# Patient Record
Sex: Female | Born: 1985 | Race: Black or African American | Hispanic: No | Marital: Single | Smoking: Never smoker
Health system: Southern US, Community
[De-identification: ages and names within clinical notes are randomized; demographics above are authoritative.]

## PROBLEM LIST (undated history)

## (undated) DIAGNOSIS — F32A Depression, unspecified: Secondary | ICD-10-CM

## (undated) DIAGNOSIS — G43909 Migraine, unspecified, not intractable, without status migrainosus: Secondary | ICD-10-CM

---

## 2007-05-02 ENCOUNTER — Emergency Department (HOSPITAL_COMMUNITY): Admission: EM | Admit: 2007-05-02 | Discharge: 2007-05-02 | Payer: Self-pay | Admitting: Emergency Medicine

## 2007-07-17 ENCOUNTER — Emergency Department (HOSPITAL_COMMUNITY): Admission: EM | Admit: 2007-07-17 | Discharge: 2007-07-17 | Payer: Self-pay | Admitting: Emergency Medicine

## 2008-03-02 ENCOUNTER — Emergency Department (HOSPITAL_COMMUNITY): Admission: EM | Admit: 2008-03-02 | Discharge: 2008-03-02 | Payer: Self-pay | Admitting: Emergency Medicine

## 2008-10-20 ENCOUNTER — Emergency Department (HOSPITAL_COMMUNITY): Admission: EM | Admit: 2008-10-20 | Discharge: 2008-10-20 | Payer: Self-pay | Admitting: Emergency Medicine

## 2009-06-24 ENCOUNTER — Emergency Department (HOSPITAL_COMMUNITY): Admission: EM | Admit: 2009-06-24 | Discharge: 2009-06-25 | Payer: Self-pay | Admitting: Emergency Medicine

## 2010-03-21 LAB — URINALYSIS, ROUTINE W REFLEX MICROSCOPIC
Bilirubin Urine: NEGATIVE
Glucose, UA: NEGATIVE mg/dL
Protein, ur: NEGATIVE mg/dL
Specific Gravity, Urine: 1.017 (ref 1.005–1.030)
Urobilinogen, UA: 1 mg/dL (ref 0.0–1.0)

## 2010-03-21 LAB — URINE MICROSCOPIC-ADD ON

## 2010-03-21 LAB — POCT PREGNANCY, URINE: Preg Test, Ur: NEGATIVE

## 2010-09-28 LAB — URINALYSIS, ROUTINE W REFLEX MICROSCOPIC
Bilirubin Urine: NEGATIVE
Ketones, ur: NEGATIVE
Nitrite: NEGATIVE
Specific Gravity, Urine: 1.02
pH: 6

## 2010-09-28 LAB — COMPREHENSIVE METABOLIC PANEL
ALT: 11
BUN: 7
Creatinine, Ser: 0.65
GFR calc Af Amer: >60
Glucose, Bld: 86
Potassium: 4.2
Total Bilirubin: 0.5

## 2010-09-28 LAB — DIFFERENTIAL: Neutro Abs: 3.3

## 2010-09-28 LAB — CBC
HCT: 36.5
RBC: 4.25
WBC: 5.8

## 2010-09-28 LAB — POCT PREGNANCY, URINE: Preg Test, Ur: NEGATIVE

## 2010-12-27 ENCOUNTER — Encounter: Payer: Self-pay | Admitting: *Deleted

## 2010-12-27 ENCOUNTER — Emergency Department (HOSPITAL_COMMUNITY)
Admission: EM | Admit: 2010-12-27 | Discharge: 2010-12-27 | Disposition: A | Discharge status: Home / Self Care | Payer: Managed Care, Other (non HMO) | Source: Home / Self Care

## 2010-12-27 ENCOUNTER — Emergency Department (INDEPENDENT_AMBULATORY_CARE_PROVIDER_SITE_OTHER): Payer: Managed Care, Other (non HMO)

## 2010-12-27 DIAGNOSIS — S93409A Sprain of unspecified ligament of unspecified ankle, initial encounter: Secondary | ICD-10-CM

## 2010-12-27 DIAGNOSIS — S93401A Sprain of unspecified ligament of right ankle, initial encounter: Secondary | ICD-10-CM

## 2010-12-27 MED ORDER — IBUPROFEN 800 MG PO TABS: 800.0000 mg | ORAL_TABLET | Freq: Three times a day (TID) | ORAL | Status: AC

## 2010-12-27 NOTE — ED Notes (Signed)
Pt  Felled  2  Days  Ago  And  Injured  Her  r  Ankle  -  She  Has  Pain /  Swelling to  The  Affected  Ankle    She  Has  Been    Using  Crutches       Which  She  Has  With  Her  She  Has  Old  Injury  To the  Ankle

## 2010-12-27 NOTE — ED Provider Notes (Signed)
Medical screening examination/treatment/procedure(s) were performed by non-physician practitioner and as supervising physician I was immediately available for consultation/collaboration.  Raynald Blend, MD 12/27/10 [phone]

## 2010-12-27 NOTE — ED Provider Notes (Signed)
History     CSN: [id number]  Arrival date & time 12/27/10  1109   None     Chief Complaint  Patient presents with  . Ankle Pain    (Consider location/radiation/quality/duration/timing/severity/associated sxs/prior treatment) HPI Comments: Pt states she slipped and fell on her bathroom floor 2 days ago injuring her Rt ankle. She c/o pain and swelling lateral Rt ankle. Pain worsens with weight bearing and walking. She took Motrin yesterday but was worried because their was no improvement today. She is using crutches from a previous ankle sprain. She denies any other injury or areas of pain.    History reviewed. No pertinent past medical history.  History reviewed. No pertinent past surgical history.  History reviewed. No pertinent family history.  History  Substance Use Topics  . Smoking status: Never Smoker   . Smokeless tobacco: Not on file  . Alcohol Use: Yes    OB History    Grav Para Term Preterm Abortions TAB SAB Ect Mult Living                  Review of Systems  Musculoskeletal: Positive for joint swelling.  Skin: Negative for color change and wound.  Neurological: Negative for dizziness and headaches.    Allergies  Review of patient's allergies indicates no known allergies.  Home Medications   Current Outpatient Rx  Name Route Sig Dispense Refill  . IBUPROFEN 800 MG PO TABS Oral Take 1 tablet (800 mg total) by mouth 3 (three) times daily. 15 tablet 0    BP 133/80  Pulse 77  Temp(Src) 97.9 F (36.6 C) (Oral)  Resp 16  SpO2 100%  LMP 12/18/2010  Physical Exam  Nursing note and vitals reviewed. Constitutional: She appears well-developed and well-nourished. No distress.  Cardiovascular: Normal rate, regular rhythm and normal heart sounds.   Pulmonary/Chest: Effort normal and breath sounds normal. No respiratory distress.  Musculoskeletal:       Right ankle: She exhibits decreased range of motion and swelling. She exhibits no ecchymosis, no  laceration and normal pulse. tenderness. Lateral malleolus (anterior) and posterior TFL tenderness found. No medial malleolus (anterior), no AITFL, no CF ligament, no head of 5th metatarsal and no proximal fibula tenderness found. Achilles tendon normal.       Neg Drawer. No ligament instability.  Neurological: She has normal strength. No sensory deficit.  Skin: Skin is warm and dry.  Psychiatric: She has a normal mood and affect.    ED Course  Procedures (including critical care time)  Labs Reviewed - No data to display Dg Ankle Complete Right  12/27/2010  *RADIOLOGY REPORT*  Clinical Data: Fall, pain.  RIGHT ANKLE - COMPLETE 3+ VIEW  Comparison: Plain films 07/17/2007.  Findings: There is some soft tissue swelling about the lateral malleolus.  No underlying fracture.  No tibiotalar joint effusion.  IMPRESSION: Lateral soft tissue swelling without underlying fracture.  Original Report Authenticated By: Bernadene Bell. D'ALESSIO, M.D.     1. Right ankle sprain       MDM  Ankle xray neg        Melody Comas, [place] 12/27/10 1416

## 2011-02-05 ENCOUNTER — Encounter (HOSPITAL_COMMUNITY): Payer: Self-pay | Admitting: *Deleted

## 2011-02-05 ENCOUNTER — Emergency Department (HOSPITAL_COMMUNITY)
Admission: EM | Admit: 2011-02-05 | Discharge: 2011-02-05 | Disposition: A | Discharge status: Home / Self Care | Payer: Managed Care, Other (non HMO) | Source: Home / Self Care | Attending: Family Medicine | Admitting: Family Medicine

## 2011-02-05 DIAGNOSIS — J020 Streptococcal pharyngitis: Secondary | ICD-10-CM

## 2011-02-05 MED ORDER — PENICILLIN V POTASSIUM 500 MG PO TABS: 500.0000 mg | ORAL_TABLET | Freq: Three times a day (TID) | ORAL | Status: AC

## 2011-02-05 NOTE — ED Notes (Signed)
Pt with onset sore throat yesterday denies cough - mild sinus congestion

## 2011-02-05 NOTE — ED Provider Notes (Signed)
History     CSN: [id number]  Arrival date & time 02/05/11  1201   First MD Initiated Contact with Patient 02/05/11 1325      Chief Complaint  Patient presents with  . Sore Throat    (Consider location/radiation/quality/duration/timing/severity/associated sxs/prior treatment) HPI Comments: Laura Rubio presents for evaluation of sore throat that started yesterday. She denies fever, cough, runny nose. 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 She says that they were evaluated for strep throat, which was negative.  Patient is a 26 y.o. female presenting with pharyngitis. The history is provided by the patient.  Sore Throat This is a new problem. The current episode started yesterday. The problem occurs constantly. The problem has not changed since onset.The symptoms are aggravated by swallowing and eating. The symptoms are relieved by nothing.    History reviewed. No pertinent past medical history.  History reviewed. No pertinent past surgical history.  History reviewed. No pertinent family history.  History  Substance Use Topics  . Smoking status: Never Smoker   . Smokeless tobacco: Not on file  . Alcohol Use: Yes    OB History    Grav Para Term Preterm Abortions TAB SAB Ect Mult Living                  Review of Systems  Constitutional: Positive for chills. Negative for fever.  HENT: Positive for sore throat. Negative for trouble swallowing.   Eyes: Negative.   Respiratory: Negative.   Cardiovascular: Negative.   Gastrointestinal: Negative.   Genitourinary: Negative.   Musculoskeletal: Positive for myalgias.  Skin: Negative.     Allergies  Review of patient's allergies indicates no known allergies.  Home Medications   Current Outpatient Rx  Name Route Sig Dispense Refill  . PENICILLIN V POTASSIUM 500 MG PO TABS Oral Take 1 tablet (500 mg total) by mouth 3 (three) times daily. 30 tablet 0    BP  128/92  Pulse 69  Temp(Src) 98.9 F (37.2 C) (Oral)  Resp 17  SpO2 100%  LMP 01/18/2011  Physical Exam  Nursing note and vitals reviewed. Constitutional: She is oriented to person, place, and time. She appears well-developed and well-nourished.  HENT:  Head: Normocephalic and atraumatic.  Right Ear: Tympanic membrane normal.  Left Ear: Tympanic membrane normal.  Mouth/Throat: Uvula is midline. Oropharyngeal exudate, posterior oropharyngeal edema and posterior oropharyngeal erythema present.       Tonsillar hypertrophy with exudates  Eyes: EOM are normal.  Neck: Normal range of motion.  Pulmonary/Chest: Effort normal.  Musculoskeletal: Normal range of motion.  Neurological: She is alert and oriented to person, place, and time.  Skin: Skin is warm and dry.  Psychiatric: Her behavior is normal.    ED Course  Procedures (including critical care time)  Labs Reviewed  POCT RAPID STREP A ([facility] ONLY) - Abnormal; Notable for the following:    Streptococcus, Group A Screen (Direct) POSITIVE (*)    All other components within normal limits   No results found.   1. Strep pharyngitis       MDM  Labs reviewed. rx given for penicillin V       Richardo Priest, MD 02/05/11 1421

## 2012-04-29 ENCOUNTER — Encounter (HOSPITAL_COMMUNITY): Payer: Self-pay | Admitting: Emergency Medicine

## 2012-04-29 ENCOUNTER — Emergency Department (HOSPITAL_COMMUNITY)
Admission: EM | Admit: 2012-04-29 | Discharge: 2012-04-30 | Disposition: A | Discharge status: Home / Self Care | Payer: Self-pay | Attending: Emergency Medicine | Admitting: Emergency Medicine

## 2012-04-29 ENCOUNTER — Emergency Department (HOSPITAL_COMMUNITY): Payer: Self-pay

## 2012-04-29 DIAGNOSIS — Z3202 Encounter for pregnancy test, result negative: Secondary | ICD-10-CM | POA: Insufficient documentation

## 2012-04-29 DIAGNOSIS — R109 Unspecified abdominal pain: Secondary | ICD-10-CM

## 2012-04-29 DIAGNOSIS — R300 Dysuria: Secondary | ICD-10-CM | POA: Insufficient documentation

## 2012-04-29 DIAGNOSIS — R824 Acetonuria: Secondary | ICD-10-CM | POA: Insufficient documentation

## 2012-04-29 LAB — CBC WITH DIFFERENTIAL/PLATELET
Basophils Relative: 0 % (ref 0–1)
HCT: 37.1 % (ref 36.0–46.0)
Hemoglobin: 12.6 g/dL (ref 12.0–15.0)
Lymphocytes Relative: 34 % (ref 12–46)
Lymphs Abs: 2.7 10*3/uL (ref 0.7–4.0)
Monocytes Absolute: 0.8 10*3/uL (ref 0.1–1.0)
Monocytes Relative: 9 % (ref 3–12)
Neutro Abs: 4.5 10*3/uL (ref 1.7–7.7)
Neutrophils Relative %: 56 % (ref 43–77)
RBC: 4.5 MIL/uL (ref 3.87–5.11)
WBC: 8 10*3/uL (ref 4.0–10.5)

## 2012-04-29 LAB — POCT I-STAT, CHEM 8
BUN: 4 mg/dL — ABNORMAL LOW (ref 6–23)
Calcium, Ion: 1.23 mmol/L (ref 1.12–1.23)
Chloride: 102 mEq/L (ref 96–112)
Glucose, Bld: 86 mg/dL (ref 70–99)
HCT: 40 % (ref 36.0–46.0)
Potassium: 3.9 mEq/L (ref 3.5–5.1)

## 2012-04-29 LAB — URINALYSIS, ROUTINE W REFLEX MICROSCOPIC
Glucose, UA: NEGATIVE mg/dL
Ketones, ur: 40 mg/dL — AB
Nitrite: NEGATIVE
Specific Gravity, Urine: 1.03 (ref 1.005–1.030)
Urobilinogen, UA: 0.2 mg/dL (ref 0.0–1.0)

## 2012-04-29 LAB — URINE MICROSCOPIC-ADD ON

## 2012-04-29 LAB — WET PREP, GENITAL: Clue Cells Wet Prep HPF POC: NONE SEEN

## 2012-04-29 MED ORDER — IOHEXOL 300 MG/ML  SOLN
100.0000 mL | Freq: Once | INTRAMUSCULAR | Status: AC | PRN
  Administered 2012-04-29: 100 mL via INTRAVENOUS

## 2012-04-29 MED ORDER — IOHEXOL 300 MG/ML  SOLN
20.0000 mL | INTRAMUSCULAR | Status: AC
  Administered 2012-04-29: 50 mL via ORAL

## 2012-04-29 NOTE — ED Notes (Signed)
Pt. Stated, I started having symptoms in March and try to treat myself with water and cranberry juice.  It came back again, I'm having burning and urinary frequency.

## 2012-04-29 NOTE — ED Notes (Signed)
Advised of the wait time 

## 2012-04-29 NOTE — ED Notes (Signed)
Pelvic done by Dr. Hyacinth Meeker with Illene Bolus as chaperone.

## 2012-04-29 NOTE — ED Provider Notes (Signed)
History     CSN: [id number]  Arrival date & time 04/29/12  1345   First MD Initiated Contact with Patient 04/29/12 1625      Chief Complaint  Patient presents with  . Urinary Frequency    (Consider location/radiation/quality/duration/timing/severity/associated sxs/prior treatment) HPI Comments: The patient is a 27 year old female G0 P0 whose had normal menstrual periods last menstrual period was 12 days ago who presents with a complaint of urinary frequency and pelvic discomfort. She states every time she urinates she feels a burning sensation in her vaginal area. She denies any vomiting or fevers and has no significant abdominal pain at rest, mild lower back discomfort which is occasional and mild. She has no history of abdominal surgery, no history of HIV or diabetes and states that she is sexually active but no vaginal discharge.  She does use a lot of soap when she showers and has admitted to changing her soap recently.  Patient is a 27 y.o. female presenting with frequency. The history is provided by the patient.  Urinary Frequency    History reviewed. No pertinent past medical history.  History reviewed. No pertinent past surgical history.  No family history on file.  History  Substance Use Topics  . Smoking status: Never Smoker   . Smokeless tobacco: Not on file  . Alcohol Use: Yes    OB History   Grav Para Term Preterm Abortions TAB SAB Ect Mult Living                  Review of Systems  Genitourinary: Positive for frequency.  All other systems reviewed and are negative.    Allergies  Review of patient's allergies indicates no known allergies.  Home Medications  No current outpatient prescriptions on file.  BP 120/74  Pulse 93  Temp(Src) 98.4 F (36.9 C) (Oral)  Resp 20  SpO2 100%  LMP 04/17/2012  Physical Exam  Nursing note and vitals reviewed. Constitutional: She appears well-developed and well-nourished. No distress.  HENT:  Head:  Normocephalic and atraumatic.  Mouth/Throat: Oropharynx is clear and moist. No oropharyngeal exudate.  Eyes: Conjunctivae and EOM are normal. Pupils are equal, round, and reactive to light. Right eye exhibits no discharge. Left eye exhibits no discharge. No scleral icterus.  Neck: Normal range of motion. Neck supple. No JVD present. No thyromegaly present.  Cardiovascular: Normal rate, regular rhythm, normal heart sounds and intact distal pulses.  Exam reveals no gallop and no friction rub.   No murmur heard. Pulmonary/Chest: Effort normal and breath sounds normal. No respiratory distress. She has no wheezes. She has no rales.  Abdominal: Soft. Bowel sounds are normal. She exhibits no distension and no mass. There is no tenderness.  Genitourinary:  Chaperone present for pelvic exam  Normal appearing external genitalia, normal appearing vaginal vault with moderate amount of clear and white colored vaginal discharge, no foul odor, no cervical motion tenderness, no adnexal tenderness, no bleeding, no foreign bodies  Musculoskeletal: Normal range of motion. She exhibits no edema and no tenderness.  Lymphadenopathy:    She has no cervical adenopathy.  Neurological: She is alert. Coordination normal.  Skin: Skin is warm and dry. No rash noted. No erythema.  Psychiatric: She has a normal mood and affect. Her behavior is normal.    ED Course  Procedures (including critical care time)  Labs Reviewed  URINALYSIS, ROUTINE W REFLEX MICROSCOPIC - Abnormal; Notable for the following:    Hgb urine dipstick TRACE (*)    Bilirubin  Urine SMALL (*)    Ketones, ur 40 (*)    Leukocytes, UA SMALL (*)    All other components within normal limits  URINE MICROSCOPIC-ADD ON - Abnormal; Notable for the following:    Squamous Epithelial / LPF FEW (*)    Bacteria, UA FEW (*)    All other components within normal limits  URINE CULTURE  POCT PREGNANCY, URINE   No results found.   No diagnosis  found.    MDM  Overall the patient is well-appearing, urinalysis is clean, no signs of infection, not pregnant though she does have mild ketonuria. She has no abdominal tenderness, clear heart and lung sounds and no tachycardia.   Pelvic exam is not show any signs of source for the patient's symptoms. There is no vaginitis, urine sample showed no infection and wet prep shows no signs of STD or bacterial vaginosis, patient will need CT scan to further rule out lower abdominal pathology causing the patient's symptoms.   Chnge of shift - care signed out to Earley Favor to f/u on CT scan, pt has non surgical abd exam at this time of change of shift  Vida Roller, MD 05/01/12 1327

## 2012-04-30 LAB — GC/CHLAMYDIA PROBE AMP
CT Probe RNA: NEGATIVE
GC Probe RNA: NEGATIVE

## 2012-04-30 MED ORDER — IBUPROFEN 600 MG PO TABS: 600.0000 mg | ORAL_TABLET | Freq: Four times a day (QID) | ORAL | Status: DC | PRN

## 2012-04-30 NOTE — ED Notes (Signed)
Pt has ride home.

## 2012-04-30 NOTE — ED Provider Notes (Signed)
  Physical Exam  BP 111/74  Pulse 79  Temp(Src) 97.6 F (36.4 C) (Oral)  Resp 20  SpO2 100%  LMP 04/17/2012  Physical Exam CT scan reviewed which does not reveal any pathology to be causing the patients discomfort  ED Course  Procedures  MDM      Arman Filter, NP 04/30/12 0024

## 2012-05-01 NOTE — ED Provider Notes (Signed)
Medical screening examination/treatment/procedure(s) were conducted as a shared visit with non-physician practitioner(s) and myself.  I personally evaluated the patient during the encounter  Please see my separate respective documentation pertaining to this patient encounter   Vida Roller, MD 05/01/12 1328

## 2014-04-07 ENCOUNTER — Other Ambulatory Visit (HOSPITAL_COMMUNITY)
Admission: RE | Admit: 2014-04-07 | Discharge: 2014-04-07 | Disposition: A | Discharge status: Home / Self Care | Payer: Self-pay | Source: Ambulatory Visit | Attending: Family Medicine | Admitting: Family Medicine

## 2014-04-07 ENCOUNTER — Encounter (HOSPITAL_COMMUNITY): Payer: Self-pay | Admitting: *Deleted

## 2014-04-07 ENCOUNTER — Inpatient Hospital Stay (HOSPITAL_COMMUNITY)
Admission: AD | Admit: 2014-04-07 | Discharge: 2014-04-07 | Disposition: A | Discharge status: Home / Self Care | Payer: Self-pay | Source: Ambulatory Visit | Attending: Obstetrics | Admitting: Obstetrics

## 2014-04-07 ENCOUNTER — Emergency Department (INDEPENDENT_AMBULATORY_CARE_PROVIDER_SITE_OTHER)
Admission: EM | Admit: 2014-04-07 | Discharge: 2014-04-07 | Disposition: A | Discharge status: Home / Self Care | Payer: Self-pay | Source: Home / Self Care | Attending: Family Medicine | Admitting: Family Medicine

## 2014-04-07 DIAGNOSIS — R102 Pelvic and perineal pain: Secondary | ICD-10-CM | POA: Insufficient documentation

## 2014-04-07 DIAGNOSIS — N898 Other specified noninflammatory disorders of vagina: Secondary | ICD-10-CM | POA: Insufficient documentation

## 2014-04-07 DIAGNOSIS — N760 Acute vaginitis: Secondary | ICD-10-CM | POA: Insufficient documentation

## 2014-04-07 DIAGNOSIS — R1032 Left lower quadrant pain: Secondary | ICD-10-CM

## 2014-04-07 DIAGNOSIS — Z113 Encounter for screening for infections with a predominantly sexual mode of transmission: Secondary | ICD-10-CM | POA: Insufficient documentation

## 2014-04-07 DIAGNOSIS — Z3202 Encounter for pregnancy test, result negative: Secondary | ICD-10-CM | POA: Insufficient documentation

## 2014-04-07 LAB — POCT URINALYSIS DIP (DEVICE)
BILIRUBIN URINE: NEGATIVE
GLUCOSE, UA: NEGATIVE mg/dL
KETONES UR: NEGATIVE mg/dL
LEUKOCYTES UA: NEGATIVE
NITRITE: NEGATIVE
PH: 7 (ref 5.0–8.0)
Protein, ur: NEGATIVE mg/dL
Specific Gravity, Urine: 1.02 (ref 1.005–1.030)
Urobilinogen, UA: 1 mg/dL (ref 0.0–1.0)

## 2014-04-07 LAB — URINALYSIS, ROUTINE W REFLEX MICROSCOPIC
BILIRUBIN URINE: NEGATIVE
Glucose, UA: NEGATIVE mg/dL
KETONES UR: NEGATIVE mg/dL
LEUKOCYTES UA: NEGATIVE
Nitrite: NEGATIVE
PH: 6.5 (ref 5.0–8.0)
PROTEIN: NEGATIVE mg/dL
Specific Gravity, Urine: 1.025 (ref 1.005–1.030)
Urobilinogen, UA: 0.2 mg/dL (ref 0.0–1.0)

## 2014-04-07 LAB — URINE MICROSCOPIC-ADD ON

## 2014-04-07 LAB — POCT PREGNANCY, URINE
PREG TEST UR: NEGATIVE
Preg Test, Ur: NEGATIVE

## 2014-04-07 LAB — POCT RAPID STREP A: Streptococcus, Group A Screen (Direct): NEGATIVE

## 2014-04-07 MED ORDER — METRONIDAZOLE 500 MG PO TABS: 500.0000 mg | ORAL_TABLET | Freq: Two times a day (BID) | ORAL | Status: DC

## 2014-04-07 NOTE — MAU Provider Note (Signed)
History     CSN: [id number]  Arrival date and time: 04/07/14 1151   First Provider Initiated Contact with Patient 04/07/14 1230      No chief complaint on file.  HPI  [name] is a 29 y.o. G0P0000 who presents to MAU today with complaint of pelvic pain off and on x 1 month. The patient states pain became worse yesterday and was present off and on all day. She states it is 6/10 at the worst. She denies pain now. She denies fever or vaginal bleeding. LMP 03/21/14. She states a small amount of thin, white discharge. She was seen at Urgent Care prior to arrival in [facility]U and had pelvic exam complete. Per provider note she was sent here for [place].   OB History    Gravida Para Term Preterm AB TAB SAB Ectopic Multiple Living        History reviewed. No pertinent past medical history.  History reviewed. No pertinent past surgical history.  History reviewed. No pertinent family history.  History  Substance Use Topics  . Smoking status: Never Smoker   . Smokeless tobacco: Not on file  . Alcohol Use: Yes     Comment: occ    Allergies: No Known Allergies  No prescriptions prior to admission    Review of Systems  Constitutional: Negative for fever and malaise/fatigue.  Gastrointestinal: Positive for abdominal pain. Negative for nausea, vomiting, diarrhea and constipation.  Genitourinary: Negative for dysuria, urgency and frequency.       Neg -vaginale bleeding + discharge   Physical Exam   Blood pressure 122/68, pulse 86, temperature 98 F (36.7 C), temperature source Oral, resp. rate 16, height  (1.626 m), weight 262 lb 6.4 oz (119.024 kg), last menstrual period 03/21/2014.  Physical Exam  Constitutional: She is oriented to person, place, and time. She appears well-developed and well-nourished. No distress.  HENT:  Head: Normocephalic.  Cardiovascular: Normal rate.   Respiratory: Effort normal.  GI: Soft. She exhibits no distension and no  mass. There is no tenderness. There is no rebound and no guarding.  Neurological: She is alert and oriented to person, place, and time.  Skin: Skin is warm and dry. No erythema.  Psychiatric: She has a normal mood and affect.   Results for orders placed or performed during the hospital encounter of 04/07/14 (from the past 24 hour(s))  Urinalysis, Routine w reflex microscopic     Status: Abnormal   Collection Time: 04/07/14 12:10 PM  Result Value Ref Range   Color, Urine YELLOW YELLOW   APPearance CLEAR CLEAR   Specific Gravity, Urine 1.025 1.005 - 1.030   pH 6.5 5.0 - 8.0   Glucose, UA NEGATIVE NEGATIVE mg/dL   Hgb urine dipstick TRACE (A) NEGATIVE   Bilirubin Urine NEGATIVE NEGATIVE   Ketones, ur NEGATIVE NEGATIVE mg/dL   Protein, ur NEGATIVE NEGATIVE mg/dL   Urobilinogen, UA 0.2 0.0 - 1.0 mg/dL   Nitrite NEGATIVE NEGATIVE   Leukocytes, UA NEGATIVE NEGATIVE  Urine microscopic-add on     Status: Abnormal   Collection Time: 04/07/14 12:10 PM  Result Value Ref Range   Squamous Epithelial / LPF FEW (A) RARE   WBC, UA 0-2 <3 WBC/hpf   RBC / HPF 0-2 <3 RBC/hpf   Bacteria, UA FEW (A) RARE  Pregnancy, urine POC     Status: None   Collection Time: 04/07/14 12:23 PM  Result Value Ref Range  Preg Test, Ur NEGATIVE NEGATIVE    MAU Course  Procedures None  MDM UPT - negative Discussed patient with Dr. Chestine Sporelark. Agrees that US is not required at this time given patient has no pain on exam. Advised to have patient follow-up in the office is symptoms continue or worsen Assessment and Plan  A: Pelvic pain Possible Bacterial vaginosis  P: Discharge home Advised Ibuprofen PRN for pain Patient encouraged to follow-up with Dr. Tenny Crawoss PRN  Patient may return to MAU as needed or if her condition were to change or worsen   Marny LowensteinJulie N Naraly Fritcher, PA-C  04/07/2014, 1:48 PM

## 2014-04-07 NOTE — ED Provider Notes (Signed)
CSN: [id number]     Arrival date & time 04/07/14  4696 History   First MD Initiated Contact with Patient 04/07/14 1054     Chief Complaint  Patient presents with  . Pelvic Pain   (Consider location/radiation/quality/duration/timing/severity/associated sxs/prior Treatment) HPI Comments: 29 year old female complaining of intermittent left pelvic pain for greater than a month. She states it the pain often occurs after ovulation and before menses. The pain has been worse in the past day and a half. She states that she did call her GYN because she is always so but without any have to wait a long time for appointment anyway. She wants to find out what is going on with her pelvic pain today. Denies vaginal discharge or sexual activity for the past 2 years.   History reviewed. No pertinent past medical history. History reviewed. No pertinent past surgical history. History reviewed. No pertinent family history. History  Substance Use Topics  . Smoking status: Never Smoker   . Smokeless tobacco: Not on file  . Alcohol Use: Yes   OB History    No data available     Review of Systems  Constitutional: Negative for fever, activity change and fatigue.  HENT: Negative.   Respiratory: Negative.   Cardiovascular: Negative for chest pain.  Genitourinary: Positive for pelvic pain. Negative for dysuria, urgency, frequency, decreased urine volume and menstrual problem.  Skin: Negative for rash.  Neurological: Negative.     Allergies  Review of patient's allergies indicates no known allergies.  Home Medications   Prior to Admission medications   Medication Sig Start Date End Date Taking? Authorizing Provider  ibuprofen (ADVIL,MOTRIN) 600 MG tablet Take 1 tablet (600 mg total) by mouth every 6 (six) hours as needed for pain. 04/30/12   Earley Favor, NP  metroNIDAZOLE (FLAGYL) 500 MG tablet Take 1 tablet (500 mg total) by mouth 2 (two) times daily. X 7 days 04/07/14   Hayden Rasmussen, NP   BP 115/79 mmHg   Pulse 75  Temp(Src) 98.3 F (36.8 C) (Oral)  Resp 12  SpO2 99%  LMP 03/21/2014 Physical Exam  Constitutional: She is oriented to person, place, and time. She appears well-developed and well-nourished. No distress.  Neck: Normal range of motion.  Cardiovascular: Normal rate, regular rhythm and normal heart sounds.   Pulmonary/Chest: Effort normal and breath sounds normal.  Abdominal: Soft.  Palpation of the right lower quadrant/pelvis produces pain to the left pelvis. Palpation of the left pelvis does not produce tenderness.  Genitourinary:  Normal external female genitalia. Large body habitus. There was some difficulty in capturing the cervix due to its far posterior position and redundant vaginal walls. There is a small amount of thin gray discharge in the vaginal vault. Ectocervix is without lesions or tenderness. Attempted bimanual however due to obesity/body habitus unable to extend sufficiently enough to locate the cervix and certainly not the adnexa.  Neurological: She is alert and oriented to person, place, and time.  Skin: Skin is warm and dry.  Nursing note and vitals reviewed.   ED Course  Procedures (including critical care time) Labs Review Labs Reviewed  POCT URINALYSIS DIP (DEVICE) - Abnormal; Notable for the following:    Hgb urine dipstick TRACE (*)    All other components within normal limits  POCT RAPID STREP A ([facility] ONLY)  POCT PREGNANCY, URINE  CERVICOVAGINAL ANCILLARY ONLY   Results for orders placed or performed during the hospital encounter of 04/07/14  POCT urinalysis dip (device)  Result  Value Ref Range   Glucose, UA NEGATIVE NEGATIVE mg/dL   Bilirubin Urine NEGATIVE NEGATIVE   Ketones, ur NEGATIVE NEGATIVE mg/dL   Specific Gravity, Urine 1.020 1.005 - 1.030   Hgb urine dipstick TRACE (A) NEGATIVE   pH 7.0 5.0 - 8.0   Protein, ur NEGATIVE NEGATIVE mg/dL   Urobilinogen, UA 1.0 0.0 - 1.0 mg/dL   Nitrite NEGATIVE NEGATIVE   Leukocytes, UA  NEGATIVE NEGATIVE  POCT rapid strep A [facility])  Result Value Ref Range   Streptococcus, Group A Screen (Direct) NEGATIVE NEGATIVE  Pregnancy, urine POC  Result Value Ref Range   Preg Test, Ur NEGATIVE NEGATIVE    Imaging Review No results found.   MDM   1. Pelvic pain in female   2. Vaginitis    Cervical cytology is pending patient probably has BV. Doubt this has anything to do with her left pelvic pain. The patient has chosen not to have a primary care doctor and she chose not to call her GYN today. She is requesting to know what is going on with the pelvic pain that she has had for a month. We are not equipped to perform sonography at this time and she has no PCP to refer to for outpatient testing. Therefore she is being discharged from the urgent care and [facility] with probable ultrasonography of the pelvis to determine the etiology of her pelvic pain. Flagyl 500 mg twice a day prescription given at the urgent care    Hayden Rasmussenavid Oshae Simmering, NP 04/07/14 1127

## 2014-04-07 NOTE — Discharge Instructions (Signed)
Pelvic Pain Female pelvic pain can be caused by many different things and start from a variety of places. Pelvic pain refers to pain that is located in the lower half of the abdomen and between your hips. The pain may occur over a short period of time (acute) or may be reoccurring (chronic). The cause of pelvic pain may be related to disorders affecting the female reproductive organs (gynecologic), but it may also be related to the bladder, kidney stones, an intestinal complication, or muscle or skeletal problems. Getting help right away for pelvic pain is important, especially if there has been severe, sharp, or a sudden onset of unusual pain. It is also important to get help right away because some types of pelvic pain can be life threatening.  CAUSES  Below are only some of the causes of pelvic pain. The causes of pelvic pain can be in one of several categories.   Gynecologic.  Pelvic inflammatory disease.  Sexually transmitted infection.  Ovarian cyst or a twisted ovarian ligament (ovarian torsion).  Uterine lining that grows outside the uterus (endometriosis).  Fibroids, cysts, or tumors.  Ovulation.  Pregnancy.  Pregnancy that occurs outside the uterus (ectopic pregnancy).  Miscarriage.  Labor.  Abruption of the placenta or ruptured uterus.  Infection.  Uterine infection (endometritis).  Bladder infection.  Diverticulitis.  Miscarriage related to a uterine infection (septic abortion).  Bladder.  Inflammation of the bladder (cystitis).  Kidney stone(s).  Gastrointestinal.  Constipation.  Diverticulitis.  Neurologic.  Trauma.  Feeling pelvic pain because of mental or emotional causes (psychosomatic).  Cancers of the bowel or pelvis. EVALUATION  Your caregiver will want to take a careful history of your concerns. This includes recent changes in your health, a careful gynecologic history of your periods (menses), and a sexual history. Obtaining your family  history and medical history is also important. Your caregiver may suggest a pelvic exam. A pelvic exam will help identify the location and severity of the pain. It also helps in the evaluation of which organ system may be involved. In order to identify the cause of the pelvic pain and be properly treated, your caregiver may order tests. These tests may include:   A pregnancy test.  Pelvic ultrasonography.  An X-ray exam of the abdomen.  A urinalysis or evaluation of vaginal discharge.  Blood tests. HOME CARE INSTRUCTIONS   Only take over-the-counter or prescription medicines for pain, discomfort, or fever as directed by your caregiver.   Rest as directed by your caregiver.   Eat a balanced diet.   Drink enough fluids to make your urine clear or pale yellow, or as directed.   Avoid sexual intercourse if it causes pain.   Apply warm or cold compresses to the lower abdomen depending on which one helps the pain.   Avoid stressful situations.   Keep a journal of your pelvic pain. Write down when it started, where the pain is located, and if there are things that seem to be associated with the pain, such as food or your menstrual cycle.  Follow up with your caregiver as directed.  SEEK MEDICAL CARE IF:  Your medicine does not help your pain.  You have abnormal vaginal discharge. SEEK IMMEDIATE MEDICAL CARE IF:   You have heavy bleeding from the vagina.   Your pelvic pain increases.   You feel light-headed or faint.   You have chills.   You have pain with urination or blood in your urine.   You have uncontrolled diarrhea   or vomiting.   You have a fever or persistent symptoms for more than 3 days.  You have a fever and your symptoms suddenly get worse.   You are being physically or sexually abused.  MAKE SURE YOU:  Understand these instructions.  Will watch your condition.  Will get help if you are not doing well or get worse. Document Released:  11/17/2003 Document Revised: 05/06/2013 Document Reviewed: 04/11/2011 ExitCare Patient Information 2015 [employer]. This information is not intended to replace advice given to you by your health care provider. Make sure you discuss any questions you have with your health care provider.  

## 2014-04-07 NOTE — Discharge Instructions (Signed)
Bacterial Vaginosis °Bacterial vaginosis is an infection of the vagina. It happens when too many of certain germs (bacteria) grow in the vagina. °HOME CARE °· Take your medicine as told by your doctor. °· Finish your medicine even if you start to feel better. °· Do not have sex until you finish your medicine and are better. °· Tell your sex partner that you have an infection. They should see their doctor for treatment. °· Practice safe sex. Use condoms. Have only one sex partner. °GET HELP IF: °· You are not getting better after 3 days of treatment. °· You have more grey fluid (discharge) coming from your vagina than before. °· You have more pain than before. °· You have a fever. °MAKE SURE YOU:  °· Understand these instructions. °· Will watch your condition. °· Will get help right away if you are not doing well or get worse. °Document Released: 09/29/2007 Document Revised: 10/10/2012 Document Reviewed: 08/01/2012 °ExitCare® Patient Information ©2015 [employer]. This information is not intended to replace advice given to you by your health care provider. Make sure you discuss any questions you have with your health care provider. ° °Pelvic Pain °Pelvic pain is pain felt below the belly button and between your hips. It can be caused by many different things. It is important to get help right away. This is especially true for severe, sharp, or unusual pain that comes on suddenly.  °HOME CARE °· Only take medicine as told by your doctor. °· Rest as told by your doctor. °· Eat a healthy diet, such as fruits, vegetables, and lean meats. °· Drink enough fluids to keep your pee (urine) clear or pale yellow, or as told. °· Avoid sex (intercourse) if it causes pain. °· Apply warm or cold packs to your lower belly (abdomen). Use the type of pack that helps the pain. °· Avoid situations that cause you stress. °· Keep a journal to track your pain. Write down: °¨ When the pain started. °¨ Where it is located. °¨ If there are  things that seem to be related to the pain, such as food or your period. °· Follow up with your doctor as told. °GET HELP RIGHT AWAY IF:  °· You have heavy bleeding from the vagina. °· You have more pelvic pain. °· You feel lightheaded or pass out (faint). °· You have chills. °· You have pain when you pee or have blood in your pee. °· You cannot stop having watery poop (diarrhea). °· You cannot stop throwing up (vomiting). °· You have a fever or lasting symptoms for more than 3 days. °· You have a fever and your symptoms suddenly get worse. °· You are being physically or sexually abused. °· Your medicine does not help your pain. °· You have fluid (discharge) coming from your vagina that is not normal. °MAKE SURE YOU: °· Understand these instructions. °· Will watch your condition. °· Will get help if you are not doing well or get worse. °Document Released: 06/08/2007 Document Revised: 06/21/2011 Document Reviewed: 04/11/2011 °ExitCare® Patient Information ©2015 [employer]. This information is not intended to replace advice given to you by your health care provider. Make sure you discuss any questions you have with your health care provider. ° °

## 2014-04-07 NOTE — MAU Note (Signed)
Pelvic pain starting 04/06/2014 continuing through today a 6-7 on pain scale. Vaginal discharge present. Denies bleeding.

## 2014-04-07 NOTE — ED Notes (Signed)
Pt is here with complaints of 1 month history of left sided pelvic pain. Pt describes pain as constant since last night. Pt has been abstinent X 2 years. Denies any chance of pregnancy or STD.

## 2014-04-08 LAB — CERVICOVAGINAL ANCILLARY ONLY
Chlamydia: NEGATIVE
Neisseria Gonorrhea: NEGATIVE
WET PREP (BD AFFIRM): NEGATIVE

## 2014-12-16 ENCOUNTER — Other Ambulatory Visit: Payer: Self-pay | Admitting: Obstetrics and Gynecology

## 2014-12-17 LAB — CYTOLOGY - PAP

## 2015-03-13 ENCOUNTER — Emergency Department (HOSPITAL_COMMUNITY)
Admission: EM | Admit: 2015-03-13 | Discharge: 2015-03-13 | Disposition: A | Discharge status: Home / Self Care | Payer: BLUE CROSS/BLUE SHIELD | Source: Home / Self Care | Attending: Emergency Medicine | Admitting: Emergency Medicine

## 2015-03-13 ENCOUNTER — Encounter (HOSPITAL_COMMUNITY): Payer: Self-pay

## 2015-03-13 DIAGNOSIS — J069 Acute upper respiratory infection, unspecified: Secondary | ICD-10-CM

## 2015-03-13 MED ORDER — AMOXICILLIN 500 MG PO CAPS: 500.0000 mg | ORAL_CAPSULE | Freq: Three times a day (TID) | ORAL | Status: DC

## 2015-03-13 NOTE — ED Provider Notes (Signed)
CSN: [id number]     Arrival date & time 03/13/15  1523 History   First MD Initiated Contact with Patient 03/13/15 1627     Chief Complaint  Patient presents with  . Sore Throat   (Consider location/radiation/quality/duration/timing/severity/associated sxs/prior Treatment) HPI History obtained from patient:  Pt presents with the cc NW:GNFAof:sore throat, ear ache Duration of symptoms:since Monday Treatment prior to arrival:OTC meds without relief Sudden onset of symptoms, many co-workers ill at work Pain score:1 Other symptoms include:congestion.   History reviewed. No pertinent past medical history. History reviewed. No pertinent past surgical history. No family history on file. Social History  Substance Use Topics  . Smoking status: Never Smoker   . Smokeless tobacco: Never Used  . Alcohol Use: Yes     Comment: occ   OB History    Gravida Para Term Preterm AB TAB SAB Ectopic Multiple Living   0 0 0 0 0 0 0 0 0 0      Review of Systems URI symptoms.  Allergies  Review of patient's allergies indicates no known allergies.  Home Medications   Prior to Admission medications   Medication Sig Start Date End Date Taking? Authorizing Provider  ibuprofen (ADVIL,MOTRIN) 600 MG tablet Take 1 tablet (600 mg total) by mouth every 6 (six) hours as needed for pain. 04/30/12  Yes Earley FavorGail Schulz, NP  amoxicillin (AMOXIL) 500 MG capsule Take 1 capsule (500 mg total) by mouth 3 (three) times daily. 03/13/15   Tharon AquasFrank C Xzaria Teo, PA  metroNIDAZOLE (FLAGYL) 500 MG tablet Take 1 tablet (500 mg total) by mouth 2 (two) times daily. X 7 days 04/07/14   Hayden Rasmussenavid Mabe, NP   Meds Ordered and Administered this Visit  Medications - No data to display  BP 115/78 mmHg  Pulse 87  Temp(Src) 98.1 F (36.7 C) (Oral)  SpO2 100%  LMP 02/15/2015 (Exact Date) No data found.   Physical Exam NURSES NOTES AND VITAL SIGNS REVIEWED. CONSTITUTIONAL: Well developed, well nourished, no acute distress HEENT: normocephalic,  atraumatic, right and left TM's are normal EYES: Conjunctiva normal NECK:normal ROM, supple, no adenopathy PULMONARY:No respiratory distress, normal effort, Lungs: CTAb/l, no wheezes, or increased work of breathing CARDIOVASCULAR: RRR, no murmur ABDOMEN: soft, ND, NT, +'ve BS MUSCULOSKELETAL: Normal ROM of all extremities,  SKIN: warm and dry without rash PSYCHIATRIC: Mood and affect, behavior are normal   ED Course  Procedures (including critical care time)  Labs Review Labs Reviewed - No data to display  Imaging Review No results found.   Visual Acuity Review  Right Eye Distance:   Left Eye Distance:   Bilateral Distance:    Right Eye Near:   Left Eye Near:    Bilateral Near:         MDM   1. URI (upper respiratory infection)     Patient is reassured that there are no issues that require transfer to higher level of care at this time or additional tests. Patient is advised to continue home symptomatic treatment. Patient is advised that if there are new or worsening symptoms to attend the emergency department, contact primary care provider, or return to UC. Instructions of care provided discharged home in stable condition. Return to work/school note provided.   THIS NOTE WAS GENERATED USING A VOICE RECOGNITION SOFTWARE PROGRAM. ALL REASONABLE EFFORTS  WERE MADE TO PROOFREAD THIS DOCUMENT FOR ACCURACY.  I have verbally reviewed the discharge instructions with the patient. A printed AVS was given to the patient.  All questions were answered  prior to discharge.      Tharon Aquas, PA 03/13/15 1701

## 2015-03-13 NOTE — Discharge Instructions (Signed)

## 2015-03-13 NOTE — ED Notes (Signed)
29 y.o./female presents with sore throat x5 days and she has been taking tylenol cold/flu and ibuprofen for pain and has had no relief. Earache in both ears and headache x2 days No acute distress

## 2015-06-14 ENCOUNTER — Encounter (HOSPITAL_BASED_OUTPATIENT_CLINIC_OR_DEPARTMENT_OTHER): Payer: Self-pay | Admitting: *Deleted

## 2015-06-14 ENCOUNTER — Emergency Department (HOSPITAL_BASED_OUTPATIENT_CLINIC_OR_DEPARTMENT_OTHER): Payer: BLUE CROSS/BLUE SHIELD

## 2015-06-14 DIAGNOSIS — M25511 Pain in right shoulder: Secondary | ICD-10-CM | POA: Insufficient documentation

## 2015-06-14 NOTE — ED Notes (Signed)
Right shoulder pain x 1 day.  Denies known injury. Tender on palpation.

## 2015-06-15 ENCOUNTER — Emergency Department (HOSPITAL_BASED_OUTPATIENT_CLINIC_OR_DEPARTMENT_OTHER)
Admission: EM | Admit: 2015-06-15 | Discharge: 2015-06-15 | Disposition: A | Discharge status: Home / Self Care | Payer: BLUE CROSS/BLUE SHIELD | Attending: Emergency Medicine | Admitting: Emergency Medicine

## 2015-06-15 DIAGNOSIS — M25511 Pain in right shoulder: Secondary | ICD-10-CM

## 2015-06-15 MED ORDER — KETOROLAC TROMETHAMINE 60 MG/2ML IM SOLN
60.0000 mg | Freq: Once | INTRAMUSCULAR | Status: AC
  Administered 2015-06-15: 60 mg via INTRAMUSCULAR
  Filled 2015-06-15: qty 2

## 2015-06-15 MED ORDER — TRAMADOL HCL 50 MG PO TABS: 50.0000 mg | ORAL_TABLET | Freq: Two times a day (BID) | ORAL | Status: DC | PRN

## 2015-06-15 NOTE — ED Notes (Signed)
Pt given d/c instructions as per chart. Verbalizes understanding. No questions. Rx x 1 with narcotic precaution instructions.

## 2015-06-15 NOTE — ED Provider Notes (Signed)
CSN: [id number]     Arrival date & time 06/14/15  2145 History  By signing my name below, I, Soijett Blue, attest that this documentation has been prepared under the direction and in the presence of Tomasita CrumbleAdeleke Alejandro Gamel, MD. Electronically Signed: Soijett Blue, ED Scribe. 06/15/2015. 12:31 AM.   Chief Complaint  Patient presents with  . Shoulder Pain      The history is provided by the patient. No language interpreter was used.    Laura SprayShaquana Rubio is a 30 y.o. female who presents to the Emergency Department complaining of right shoulder pain onset yesterday. She notes that her right shoulder pain is worsened with movement. Pt denies any recent injury/trauma/heavy lifting to her right shoulder. Denies right shoulder pain in the past. She notes that she has tried aleve with mild relief of her symptoms. She denies color change, wound, rash, abdominal pain, vomiting, appetite change, and any other symptoms.    History reviewed. No pertinent past medical history. History reviewed. No pertinent past surgical history. History reviewed. No pertinent family history. Social History  Substance Use Topics  . Smoking status: Never Smoker   . Smokeless tobacco: Never Used  . Alcohol Use: Yes     Comment: occ   OB History    Gravida Para Term Preterm AB TAB SAB Ectopic Multiple Living   0 0 0 0 0 0 0 0 0 0      Review of Systems  A complete 10 system review of systems was obtained and all systems are negative except as noted in the HPI and PMH.   Allergies  Review of patient's allergies indicates no known allergies.  Home Medications   Prior to Admission medications   Not on File   BP 131/84 mmHg  Pulse 72  Temp(Src) 98.5 F (36.9 C) (Oral)  Resp 18  Ht 5\' 4"  (1.626 m)  Wt 260 lb (117.935 kg)  BMI 44.61 kg/m2  SpO2 99%  LMP 05/14/2015 Physical Exam  Constitutional: She is oriented to person, place, and time. She appears well-developed and well-nourished. No distress.  HENT:  Head:  Normocephalic and atraumatic.  Nose: Nose normal.  Mouth/Throat: Oropharynx is clear and moist. No oropharyngeal exudate.  Eyes: Conjunctivae and EOM are normal. Pupils are equal, round, and reactive to light. No scleral icterus.  Neck: Normal range of motion. Neck supple. No JVD present. No tracheal deviation present. No thyromegaly present.  Cardiovascular: Normal rate, regular rhythm and normal heart sounds.  Exam reveals no gallop and no friction rub.   No murmur heard. Pulmonary/Chest: Effort normal and breath sounds normal. No respiratory distress. She has no wheezes. She exhibits no tenderness.  Abdominal: Soft. Bowel sounds are normal. She exhibits no distension and no mass. There is no tenderness. There is no rebound and no guarding.  Musculoskeletal: Normal range of motion. She exhibits no edema or tenderness.  TTP of posterior right shoulder. Nl ROM. No swelling. No warmth. Nl pulses and sensation distally.   Lymphadenopathy:    She has no cervical adenopathy.  Neurological: She is alert and oriented to person, place, and time. No cranial nerve deficit. She exhibits normal muscle tone.  Skin: Skin is warm and dry. No rash noted. No erythema. No pallor.  Nursing note and vitals reviewed.   ED Course  Procedures (including critical care time) DIAGNOSTIC STUDIES: Oxygen Saturation is 99% on RA, nl by my interpretation.    COORDINATION OF CARE: 12:30 AM Discussed treatment plan with pt at bedside which includes  right shoulder xray, toradol injection, referral to orthopedist, and pt agreed to plan.    Labs Review Labs Reviewed - No data to display  Imaging Review Dg Shoulder Right  06/15/2015  CLINICAL DATA:  RIGHT shoulder pain. EXAM: RIGHT SHOULDER - 2+ VIEW COMPARISON:  None. FINDINGS: The humeral head is well-formed and located. The subacromial, glenohumeral and acromioclavicular joint spaces are intact. No destructive bony lesions. Soft tissue planes are non-suspicious.  IMPRESSION: Negative. Electronically Signed   By: Awilda Metro M.D.   On: 06/15/2015 00:43   I have personally reviewed and evaluated these images as part of my medical decision-making.   EKG Interpretation None      MDM   Final diagnoses:  None    Patient presents to the ED for shoulder pain, nontraumatic.  Physical exam does reveal point tenderness, x-ray is negative. Patient has a normal range of motion. Possible rotator cuff injury. She was given Toradol injection emergency department. We'll discharge home with tendinosis of terminal to take for severe pain. Orthopedic follow-up was provided. She appears well and in no acute distress, vital signs were within her normal limits and she is safe for discharge.   I personally performed the services described in this documentation, which was scribed in my presence. The recorded information has been reviewed and is accurate.     Tomasita Crumble, MD 06/15/15 [id number]

## 2015-06-15 NOTE — Discharge Instructions (Signed)
Joint Pain Ms. Fitterer, take tylenol or ibuprofen as needed for pain control.  Use ice and rest to help as well. If the pain becomes severe, take tramadol, know that this medication can make you drowsy.  See orthopaedic surgery within 3 days for close follow up and possible MRI to evaluate for rotator cuff injury.  If any symptoms worsen, come back to the ED immediately. Thank you. Joint pain can be caused by many things. The joint can be bruised, infected, weak from aging, or sore from exercise. The pain will probably go away if you follow your doctor's instructions for home care. If your joint pain continues, more tests may be needed to help find the cause of your condition. HOME CARE Watch your condition for any changes. Follow these instructions as told to lessen the pain that you are feeling:  Take medicines only as told by your doctor.  Rest the sore joint for as long as told by your doctor. If your doctor tells you to, raise (elevate) the painful joint above the level of your heart while you are sitting or lying down.  Do not do things that cause pain or make the pain worse.  If told, put ice on the painful area:  Put ice in a plastic bag.  Place a towel between your skin and the bag.  Leave the ice on for 20 minutes, 2-3 times per day.  Wear an elastic bandage, splint, or sling as told by your doctor. Loosen the bandage or splint if your fingers or toes lose feeling (become numb) and tingle, or if they turn cold and blue.  Begin exercising or stretching the joint as told by your doctor. Ask your doctor what types of exercise are safe for you.  Keep all follow-up visits as told by your doctor. This is important. GET HELP IF:  Your pain gets worse and medicine does not help it.  Your joint pain does not get better in 3 days.  You have more bruising or swelling.  You have a fever.  You lose 10 pounds (4.5 kg) or more without trying. GET HELP RIGHT AWAY IF:  You are not able  to move the joint.  Your fingers or toes become numb or they turn cold and blue.   This information is not intended to replace advice given to you by your health care provider. Make sure you discuss any questions you have with your health care provider.   Document Released: 12/08/2008 Document Revised: 01/10/2014 Document Reviewed: 10/01/2013 Elsevier Interactive Patient Education [employer]

## 2015-08-30 ENCOUNTER — Encounter (HOSPITAL_COMMUNITY): Payer: Self-pay | Admitting: Emergency Medicine

## 2015-08-30 ENCOUNTER — Ambulatory Visit (HOSPITAL_COMMUNITY)
Admission: EM | Admit: 2015-08-30 | Discharge: 2015-08-30 | Disposition: A | Discharge status: Home / Self Care | Payer: BLUE CROSS/BLUE SHIELD | Attending: Surgery | Admitting: Surgery

## 2015-08-30 DIAGNOSIS — G4489 Other headache syndrome: Secondary | ICD-10-CM

## 2015-08-30 DIAGNOSIS — M6249 Contracture of muscle, multiple sites: Secondary | ICD-10-CM | POA: Diagnosis not present

## 2015-08-30 DIAGNOSIS — M62838 Other muscle spasm: Secondary | ICD-10-CM

## 2015-08-30 MED ORDER — KETOROLAC TROMETHAMINE 30 MG/ML IJ SOLN
30.0000 mg | Freq: Once | INTRAMUSCULAR | Status: AC
  Administered 2015-08-30: 30 mg via INTRAMUSCULAR

## 2015-08-30 MED ORDER — KETOROLAC TROMETHAMINE 30 MG/ML IJ SOLN
INTRAMUSCULAR | Status: AC
  Filled 2015-08-30: qty 1

## 2015-08-30 MED ORDER — DICLOFENAC SODIUM 75 MG PO TBEC: 75.0000 mg | DELAYED_RELEASE_TABLET | Freq: Two times a day (BID) | ORAL | 0 refills | Status: DC

## 2015-08-30 MED ORDER — METHYLPREDNISOLONE ACETATE 40 MG/ML IJ SUSP
40.0000 mg | Freq: Once | INTRAMUSCULAR | Status: AC
  Administered 2015-08-30: 40 mg via INTRAMUSCULAR

## 2015-08-30 MED ORDER — METHOCARBAMOL 500 MG PO TABS: 500.0000 mg | ORAL_TABLET | Freq: Four times a day (QID) | ORAL | 0 refills | Status: DC

## 2015-08-30 MED ORDER — METHYLPREDNISOLONE ACETATE 40 MG/ML IJ SUSP
INTRAMUSCULAR | Status: AC
  Filled 2015-08-30: qty 1

## 2015-08-30 NOTE — ED Triage Notes (Signed)
Reports headache and left neck pain.  Left neck pain for 3 weeks.  Patient denies injury.  Patient reports having bought new pillows AFTER pain started .  Neck pain has continued and goes into left shoulder

## 2015-08-30 NOTE — ED Notes (Signed)
Reviewed instructions and scripts x 2 , reviewed follow up care and provided patient with a work note

## 2016-02-01 ENCOUNTER — Other Ambulatory Visit: Payer: Self-pay | Admitting: Obstetrics and Gynecology

## 2016-02-02 LAB — CYTOLOGY - PAP

## 2017-12-01 ENCOUNTER — Other Ambulatory Visit: Payer: Self-pay

## 2017-12-01 ENCOUNTER — Emergency Department (HOSPITAL_BASED_OUTPATIENT_CLINIC_OR_DEPARTMENT_OTHER): Payer: BLUE CROSS/BLUE SHIELD

## 2017-12-01 ENCOUNTER — Emergency Department (HOSPITAL_BASED_OUTPATIENT_CLINIC_OR_DEPARTMENT_OTHER)
Admission: EM | Admit: 2017-12-01 | Discharge: 2017-12-01 | Disposition: A | Discharge status: Home / Self Care | Payer: BLUE CROSS/BLUE SHIELD | Attending: Emergency Medicine | Admitting: Emergency Medicine

## 2017-12-01 ENCOUNTER — Encounter (HOSPITAL_BASED_OUTPATIENT_CLINIC_OR_DEPARTMENT_OTHER): Payer: Self-pay | Admitting: Emergency Medicine

## 2017-12-01 DIAGNOSIS — M25512 Pain in left shoulder: Secondary | ICD-10-CM | POA: Diagnosis not present

## 2017-12-01 MED ORDER — IBUPROFEN 400 MG PO TABS
600.0000 mg | ORAL_TABLET | Freq: Once | ORAL | Status: AC
  Administered 2017-12-01: 600 mg via ORAL
  Filled 2017-12-01: qty 1

## 2017-12-01 NOTE — ED Notes (Signed)
ED Provider at bedside. 

## 2017-12-01 NOTE — ED Notes (Signed)
Pt ambulatory to XR at this time

## 2017-12-01 NOTE — ED Triage Notes (Signed)
Reports left shoulder pain x 1 week.  Denies injury, c/o tenderness on palpation.

## 2017-12-01 NOTE — Discharge Instructions (Addendum)
Please read instructions below. Apply ice to your shoulder for 20 minutes at a time. You can take ibuprofen 800mg  every 6 hours as needed for pain.  You may alternate this with Tylenol 1000 mg every 6 hours as needed for pain. Schedule an appointment with your orthopedic specialist, or use the referral provided, in 1-2 weeks for follow-up on your injury. Return to the ER for new or concerning symptoms.

## 2017-12-01 NOTE — ED Provider Notes (Signed)
[facility] EMERGENCY DEPARTMENT Provider Note   CSN: [id number] Arrival date & time: 12/01/17  2231     History   Chief Complaint Chief Complaint  Patient presents with  . Shoulder Pain    HPI Laura Rubio is a 32 y.o. female without significant PMHx, presenting to the ED with complaint of acute onset of persistent left shoulder pain that began last week. Patient states she doesn't do any strenuous activity for work, works at a call center. She states she does sleep on her left side, and may have slept wrong. Also states last week she was pulling her self up onto the edge of the tub to get something off the ceiling, and potentially could have injured her shoulder that way. Pain is worse with lifting her arm and palpation.  She has been taking Tylenol and applied icy hot for symptoms without much relief.  Denies chest pain, shortness of breath, respiratory symptoms. States it feels muscular. Right-hand dominant.  The history is provided by the patient.    History reviewed. No pertinent past medical history.  There are no active problems to display for this patient.   History reviewed. No pertinent surgical history.   OB History    Gravida  0   Para  0   Term  0   Preterm  0   AB  0   Living  0     SAB  0   TAB  0   Ectopic  0   Multiple  0   Live Births               Home Medications    Prior to Admission medications   Medication Sig Start Date End Date Taking? Authorizing Provider  diclofenac (VOLTAREN) 75 MG EC tablet Take 1 tablet (75 mg total) by mouth 2 (two) times daily. 08/30/15   Naida Sleight, PA-C  methocarbamol (ROBAXIN) 500 MG tablet Take 1 tablet (500 mg total) by mouth 4 (four) times daily. 08/30/15   Naida Sleight, PA-C  traMADol (ULTRAM) 50 MG tablet Take 1 tablet (50 mg total) by mouth every 12 (twelve) hours as needed for severe pain. 06/15/15   Tomasita Crumble, MD    Family History Family History  Problem Relation Age  of Onset  . Stroke Mother   . Cancer Maternal Grandmother     Social History Social History   Tobacco Use  . Smoking status: Never Smoker  . Smokeless tobacco: Never Used  Substance Use Topics  . Alcohol use: Yes    Comment: occ  . Drug use: No     Allergies   Patient has no known allergies.   Review of Systems Review of Systems  Musculoskeletal: Positive for myalgias.  All other systems reviewed and are negative.    Physical Exam Updated Vital Signs BP 136/82   Pulse 83   Temp 98.1 F (36.7 C) (Oral)   Resp 20   Ht 5\' 4"  (1.626 m)   Wt 134.3 kg   LMP 11/29/2017 (Approximate)   SpO2 100%   BMI 50.81 kg/m   Physical Exam  Constitutional: She appears well-developed and well-nourished. No distress.  HENT:  Head: Normocephalic and atraumatic.  Eyes: Conjunctivae are normal.  Cardiovascular: Normal rate and regular rhythm.  Pulmonary/Chest: Effort normal and breath sounds normal.  Musculoskeletal:  Left shoulder without deformity or swelling. TTP to anterior and lateral aspect of left shoulder. Pain with passive forward flexion of shoulder. Pain with active  ROM in all directions. Pain with resistive internal rotation of shoulder. 5/5 grip strength.   Psychiatric: She has a normal mood and affect. Her behavior is normal.  Nursing note and vitals reviewed.    ED Treatments / Results  Labs (all labs ordered are listed, but only abnormal results are displayed) Labs Reviewed - No data to display  EKG None  Radiology No results found.  Procedures Procedures (including critical care time)  Medications Ordered in ED Medications - No data to display   Initial Impression / Assessment and Plan / ED Course  I have reviewed the triage vital signs and the nursing notes.  Pertinent labs & imaging results that were available during my care of the patient were reviewed by me and considered in my medical decision making (see chart for details).     Pt  presenting with 1 week of left shoulder pain, worse with movement and palpation. Exam with tenderness, pain with active and passive ROM, pain with resistive internal rotation. Denies CP or respiratory sx. Consider rotator cuff injury. Xray to rule out other acute causes of pain. Plan to place in sling and follow up with orthopedics if xray neg. Care assumed at shift change by Dr. Elesa MassedWard, pending xray result.  Final Clinical Impressions(s) / ED Diagnoses   Final diagnoses:  Acute pain of left shoulder    ED Discharge Orders    None       Sriansh Farra, [place] N, PA-C 12/01/17 2306    Ward, Layla MawKristen N, DO 12/01/17 2330

## 2019-04-11 ENCOUNTER — Ambulatory Visit
Admission: EM | Admit: 2019-04-11 | Discharge: 2019-04-11 | Disposition: A | Discharge status: Home / Self Care | Payer: BC Managed Care – PPO

## 2019-04-11 ENCOUNTER — Other Ambulatory Visit: Payer: Self-pay

## 2019-04-11 DIAGNOSIS — J302 Other seasonal allergic rhinitis: Secondary | ICD-10-CM | POA: Diagnosis not present

## 2019-04-11 DIAGNOSIS — R0981 Nasal congestion: Secondary | ICD-10-CM | POA: Diagnosis not present

## 2019-04-11 DIAGNOSIS — R5381 Other malaise: Secondary | ICD-10-CM | POA: Diagnosis not present

## 2019-04-11 MED ORDER — FLUTICASONE PROPIONATE 50 MCG/ACT NA SUSP: 1.0000 | Freq: Every day | NASAL | 0 refills | Status: DC

## 2019-04-11 NOTE — Discharge Instructions (Signed)
Start flonase, atrovent nasal spray for nasal congestion/drainage. You can use over the counter nasal saline rinse such as neti pot for nasal congestion. Keep hydrated, your urine should be clear to pale yellow in color. Tylenol/motrin for fever and pain. Monitor for any worsening of symptoms, chest pain, shortness of breath, wheezing, swelling of the throat, go to the emergency department for further evaluation needed.  

## 2019-04-11 NOTE — ED Provider Notes (Signed)
[facility]    CSN: [id number] Arrival date & time: 04/11/19  0831      History   Chief Complaint Chief Complaint  Patient presents with  . Nasal Congestion    HPI Laura Rubio is a 34 y.o. female with history of obesity presenting for 1 week course of postnasal drip, cough, sinus pressure, and watery eyes.  Patient has taken sinus and allergy medications without relief.  Patient also endorsing mild, frontal headache that is intermittent and malaise.  Denies known sick contacts.  Cough is nonproductive, and she is without chest pain or shortness of breath.  No GI or urinary symptoms.   History reviewed. No pertinent past medical history.  There are no problems to display for this patient.   History reviewed. No pertinent surgical history.  OB History    Gravida  0   Para  0   Term  0   Preterm  0   AB  0   Living  0     SAB  0   TAB  0   Ectopic  0   Multiple  0   Live Births               Home Medications    Prior to Admission medications   Medication Sig Start Date End Date Taking? Authorizing Provider  sertraline (ZOLOFT) 100 MG tablet Take 100 mg by mouth daily.   Yes [provider]  fluticasone (FLONASE) 50 MCG/ACT nasal spray Place 1 spray into both nostrils daily. 04/11/19   Hall-Potvin, [place], PA-C    Family History Family History  Problem Relation Age of Onset  . Stroke Mother   . Cancer Maternal Grandmother     Social History Social History   Tobacco Use  . Smoking status: Never Smoker  . Smokeless tobacco: Never Used  Substance Use Topics  . Alcohol use: Yes    Comment: occ  . Drug use: No     Allergies   Patient has no known allergies.   Review of Systems As per HPI   Physical Exam Triage Vital Signs ED Triage Vitals  Enc Vitals Group     BP      Pulse      Resp      Temp      Temp src      SpO2      Weight      Height      Head Circumference      Peak Flow      Pain Score       Pain Loc      Pain Edu?      Excl. in [place]?    No data found.  Updated Vital Signs BP 127/90 (BP Location: Left Arm)   Pulse 96   Temp 98.3 F (36.8 C) (Oral)   Resp 16   LMP 03/13/2019   SpO2 96%   Visual Acuity Right Eye Distance:   Left Eye Distance:   Bilateral Distance:    Right Eye Near:   Left Eye Near:    Bilateral Near:     Physical Exam Constitutional:      General: She is not in acute distress.    Appearance: She is obese. She is not toxic-appearing.  HENT:     Head: Normocephalic and atraumatic.     Right Ear: Tympanic membrane, ear canal and external ear normal.     Left Ear: Tympanic membrane, ear canal and external  ear normal.     Nose: No nasal deformity.     Comments: Bilateral turbinate edema.  Negative sinus tenderness bilaterally    Mouth/Throat:     Mouth: Mucous membranes are moist.     Tongue: Tongue does not deviate from midline.     Pharynx: Oropharynx is clear. Uvula midline.     Comments: No tonsillar hypertrophy or exudate Eyes:     General: No scleral icterus.    Conjunctiva/sclera: Conjunctivae normal.     Pupils: Pupils are equal, round, and reactive to light.  Cardiovascular:     Rate and Rhythm: Normal rate and regular rhythm.  Pulmonary:     Effort: Pulmonary effort is normal. No respiratory distress.     Breath sounds: No wheezing.  Musculoskeletal:     Cervical back: Normal range of motion and neck supple. No tenderness. No muscular tenderness.  Lymphadenopathy:     Cervical: No cervical adenopathy.  Neurological:     Mental Status: She is alert.      UC Treatments / Results  Labs (all labs ordered are listed, but only abnormal results are displayed) Labs Reviewed  NOVEL CORONAVIRUS, NAA    EKG   Radiology No results found.  Procedures Procedures (including critical care time)  Medications Ordered in UC Medications - No data to display  Initial Impression / Assessment and Plan / UC Course  I have  reviewed the triage vital signs and the nursing notes.  Pertinent labs & imaging results that were available during my care of the patient were reviewed by me and considered in my medical decision making (see chart for details).     Patient afebrile, nontoxic, with SpO2 96%.  Covid PCR pending.  Patient to quarantine until results are back.  We will continue supportive management, add Flonase and push fluids.  If Covid is positive, would try supportive treatment for total of 2 weeks before considering antibiotics.  If negative, would consider antibiotics after 4/10 if patient having persistent or worsening symptoms.  Return precautions discussed, patient verbalized understanding and is agreeable to plan. Final Clinical Impressions(s) / UC Diagnoses   Final diagnoses:  Seasonal allergies  Nasal congestion  Malaise     Discharge Instructions     Start flonase, atrovent nasal spray for nasal congestion/drainage. You can use over the counter nasal saline rinse such as neti pot for nasal congestion. Keep hydrated, your urine should be clear to pale yellow in color. Tylenol/motrin for fever and pain. Monitor for any worsening of symptoms, chest pain, shortness of breath, wheezing, swelling of the throat, go to the emergency department for further evaluation needed.     ED Prescriptions    Medication Sig Dispense Auth. Provider   fluticasone (FLONASE) 50 MCG/ACT nasal spray Place 1 spray into both nostrils daily. 16 g Hall-Potvin, [place], PA-C     PDMP not reviewed this encounter.   Hall-Potvin, [place], [place] 04/11/19 [phone]

## 2019-04-11 NOTE — ED Triage Notes (Signed)
Pt c/o post nasal drip making her cough x1wk, worse at night. States having sinus pressure and watery eyes for the past week. States has taking sinus and allergy meds with no relief.

## 2019-04-12 LAB — SARS-COV-2, NAA 2 DAY TAT

## 2019-04-12 LAB — NOVEL CORONAVIRUS, NAA: SARS-CoV-2, NAA: NOT DETECTED

## 2019-11-12 ENCOUNTER — Ambulatory Visit (INDEPENDENT_AMBULATORY_CARE_PROVIDER_SITE_OTHER): Payer: BC Managed Care – PPO | Admitting: Primary Care

## 2020-07-28 ENCOUNTER — Emergency Department (HOSPITAL_BASED_OUTPATIENT_CLINIC_OR_DEPARTMENT_OTHER)
Admission: EM | Admit: 2020-07-28 | Discharge: 2020-07-28 | Disposition: A | Discharge status: Home / Self Care | Payer: BC Managed Care – PPO | Attending: Emergency Medicine | Admitting: Emergency Medicine

## 2020-07-28 ENCOUNTER — Encounter (HOSPITAL_BASED_OUTPATIENT_CLINIC_OR_DEPARTMENT_OTHER): Payer: Self-pay | Admitting: Emergency Medicine

## 2020-07-28 ENCOUNTER — Other Ambulatory Visit: Payer: Self-pay

## 2020-07-28 DIAGNOSIS — R112 Nausea with vomiting, unspecified: Secondary | ICD-10-CM | POA: Diagnosis not present

## 2020-07-28 DIAGNOSIS — R519 Headache, unspecified: Secondary | ICD-10-CM | POA: Diagnosis present

## 2020-07-28 DIAGNOSIS — R42 Dizziness and giddiness: Secondary | ICD-10-CM | POA: Insufficient documentation

## 2020-07-28 HISTORY — DX: Depression, unspecified: F32.A

## 2020-07-28 HISTORY — DX: Migraine, unspecified, not intractable, without status migrainosus: G43.909

## 2020-07-28 MED ORDER — DEXAMETHASONE SODIUM PHOSPHATE 10 MG/ML IJ SOLN
10.0000 mg | Freq: Once | INTRAMUSCULAR | Status: AC
  Administered 2020-07-28: 10 mg via INTRAVENOUS
  Filled 2020-07-28: qty 1

## 2020-07-28 MED ORDER — METOCLOPRAMIDE HCL 5 MG/ML IJ SOLN
10.0000 mg | Freq: Once | INTRAMUSCULAR | Status: AC
  Administered 2020-07-28: 10 mg via INTRAVENOUS
  Filled 2020-07-28: qty 2

## 2020-07-28 MED ORDER — SODIUM CHLORIDE 0.9 % IV BOLUS
1000.0000 mL | Freq: Once | INTRAVENOUS | Status: AC
  Administered 2020-07-28: 1000 mL via INTRAVENOUS

## 2020-07-28 NOTE — ED Notes (Signed)
States has been having headaches off and on since Saturday.  Onset last night of frontal headache associated with some nausea and vomiting once this am.  Laura Rubio to urgent care but sent here due to concerns of high BP.

## 2020-07-28 NOTE — ED Provider Notes (Signed)
[facility] EMERGENCY DEPT Provider Note   CSN: [id number] Arrival date & time: 07/28/20  1348     History Chief Complaint  Patient presents with   Headache    Laura Rubio is a 35 y.o. female.  Laura Rubio was sent from urgent care.  She presented there because she has had a headache off and on for about 3 days.  The headache is typical of her prior headaches, but today, after becoming swimmy headed and having an episode of nausea and vomiting, she took her blood pressure.  The numbers are in the 180s over 100s.  She was very concerned about this, and she presented to urgent care.  At urgent care, her blood pressure was approximately 140/92, and she was sent to the emergency department for further management.  The history is provided by the patient.  Headache Pain location:  Frontal Quality: throbbing. Pain severity now: moderate. Pain scale at highest: moderate. Duration:  3 days Timing:  Constant Progression:  Waxing and waning Chronicity:  Recurrent Similar to prior headaches: yes   Context comment:  No known cause Relieved by:  Acetaminophen Worsened by:  Nothing Associated symptoms: dizziness, nausea and vomiting   Associated symptoms: no abdominal pain, no back pain, no cough, no ear pain, no eye pain, no facial pain, no fever, no loss of balance, no seizures, no sore throat and no URI       Past Medical History:  Diagnosis Date   Depression    Migraines     There are no problems to display for this patient.   History reviewed. No pertinent surgical history.   OB History     Gravida  0   Para  0   Term  0   Preterm  0   AB  0   Living  0      SAB  0   IAB  0   Ectopic  0   Multiple  0   Live Births              Family History  Problem Relation Age of Onset   Stroke Mother    Cancer Maternal Grandmother     Social History   Tobacco Use   Smoking status: Never   Smokeless tobacco: Never  Substance  Use Topics   Alcohol use: Yes    Comment: occ   Drug use: No    Home Medications Prior to Admission medications   Medication Sig Start Date End Date Taking? Authorizing Provider  fluticasone (FLONASE) 50 MCG/ACT nasal spray Place 1 spray into both nostrils daily. 04/11/19   Hall-Potvin, [place], PA-C  sertraline (ZOLOFT) 100 MG tablet Take 150 mg by mouth daily.    [provider]    Allergies    Patient has no known allergies.  Review of Systems   Review of Systems  Constitutional:  Negative for chills and fever.  HENT:  Negative for ear pain and sore throat.   Eyes:  Negative for pain and visual disturbance.  Respiratory:  Negative for cough and shortness of breath.   Cardiovascular:  Negative for chest pain and palpitations.  Gastrointestinal:  Positive for nausea and vomiting. Negative for abdominal pain.  Genitourinary:  Negative for dysuria and hematuria.  Musculoskeletal:  Negative for arthralgias and back pain.  Skin:  Negative for color change and rash.  Neurological:  Positive for dizziness and headaches. Negative for seizures, syncope and loss of balance.  All other systems reviewed and  are negative.  Physical Exam Updated Vital Signs BP 120/74 (BP Location: Right Arm)   Pulse 86   Temp 98.1 F (36.7 C) (Oral)   Resp 18   Ht 5\' 4"  (1.626 m)   Wt (!) 147.4 kg   LMP 07/18/2020 (Exact Date)   SpO2 100%   BMI 55.79 kg/m   Physical Exam Vitals and nursing note reviewed.  HENT:     Head: Normocephalic and atraumatic.  Eyes:     General: No scleral icterus. Pulmonary:     Effort: Pulmonary effort is normal. No respiratory distress.  Musculoskeletal:     Cervical back: Normal range of motion.  Skin:    General: Skin is warm and dry.  Neurological:     Mental Status: She is alert and oriented to person, place, and time. Mental status is at baseline.  Psychiatric:        Mood and Affect: Mood normal.    ED Results / Procedures / Treatments    Labs (all labs ordered are listed, but only abnormal results are displayed) Labs Reviewed - No data to display  EKG None  Radiology No results found.  Procedures Procedures   Medications Ordered in ED Medications  sodium chloride 0.9 % bolus 1,000 mL (has no administration in time range)  metoCLOPramide (REGLAN) injection 10 mg (has no administration in time range)  dexamethasone (DECADRON) injection 10 mg (has no administration in time range)    ED Course  I have reviewed the triage vital signs and the nursing notes.  Pertinent labs & imaging results that were available during my care of the patient were reviewed by me and considered in my medical decision making (see chart for details).    MDM Rules/Calculators/A&P                           Shatika Grinnell presents with a headache and possible hypertension.  Currently her blood pressure is normal.  She does have a history of headaches, and this is possibly a migraine.  We talked about the utility of even doing any testing given the fact that it is not well-established that she is in fact hypertensive.  I do not think it would add to the clinical picture as I think she requires further blood pressure monitoring to establish such a diagnosis.  I offered a COVID test, but she states that she had one at home, and it was normal.  At this point, we decided that symptomatic management would be most beneficial for her.  Not concerned about acute neurologic pathology such as stroke, vascular pathology, infection.  She was given symptomatic treatment and will be referred back to her PCP for further evaluation. I did counsel her on taking her BP at home and recording the values.   Final Clinical Impression(s) / ED Diagnoses Final diagnoses:  Acute nonintractable headache, unspecified headache type    Rx / DC Orders ED Discharge Orders     None        Sheryle Spray, MD 07/28/20 1549

## 2020-07-28 NOTE — ED Triage Notes (Signed)
Pt was at work this morning, sitting at desk and got dizzy and threw up once.  Pt also reports intermittent headache since Saturday.  Pt went to UC for evaluation and was advised to come here.  Pt took tylenol last night which relieved headache.  Pt denies any previous issues with BP but that she had felt more stressed recently.

## 2020-11-28 IMAGING — CR DG SHOULDER 2+V*L*
3 series · 3 of 3 positions shown · non-contrast
Comparison: None.

CLINICAL DATA: Left shoulder pain for 1 week.  No injury.

EXAM:
LEFT SHOULDER - 2+ VIEW

[w shoulder grashey left]
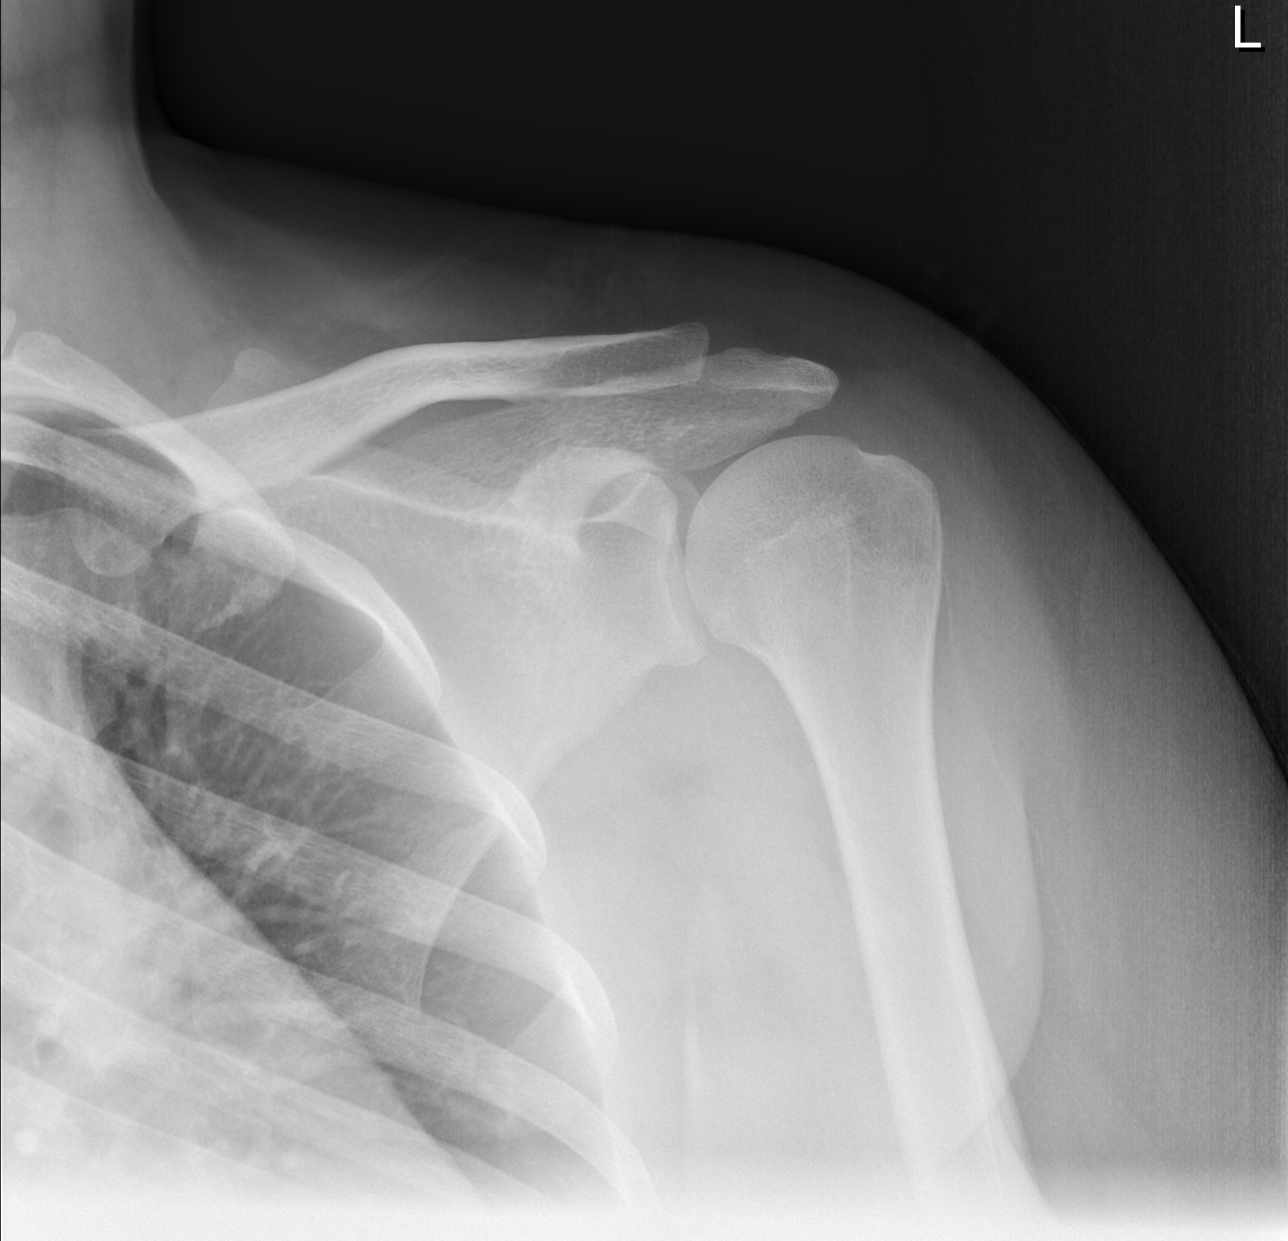

[w shoulder y view left]
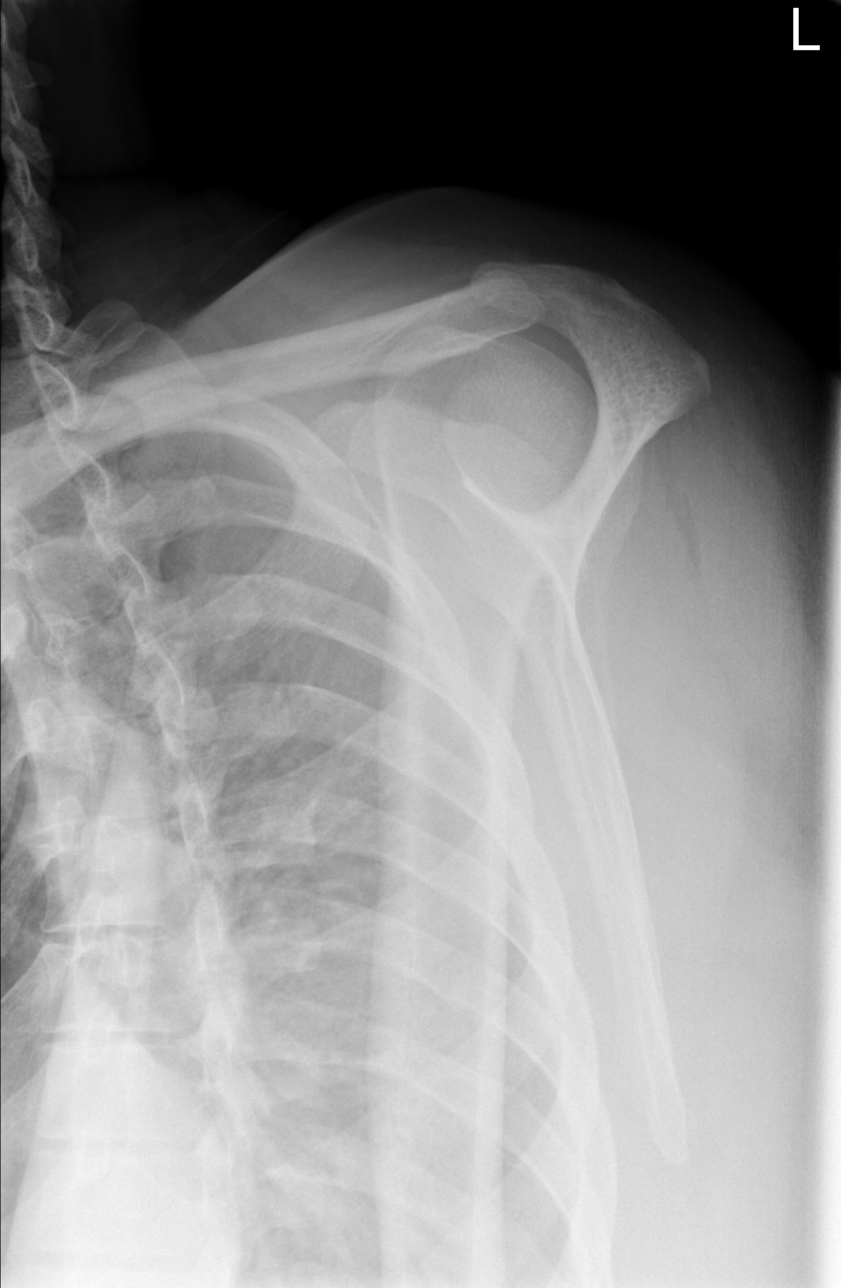

[x shoulder axillary left]
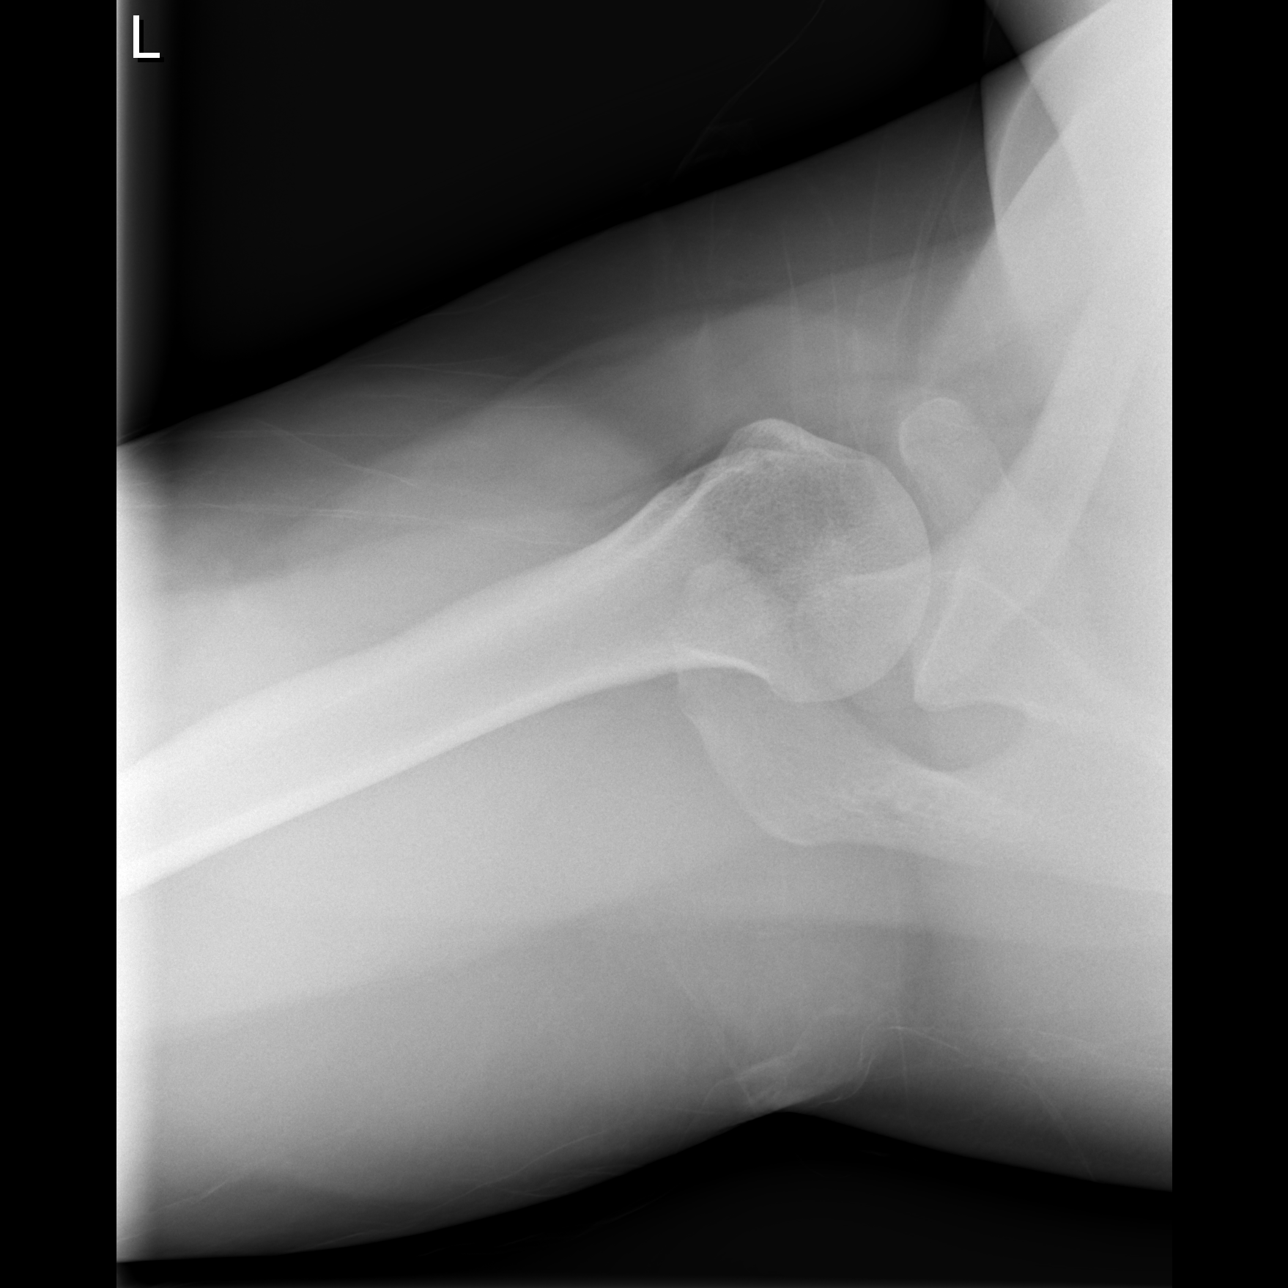

[3 of 3 positions shown; findings below may reference images not displayed]

FINDINGS: There is no evidence of fracture or dislocation. There is no
evidence of arthropathy or other focal bone abnormality. Soft
tissues are unremarkable.
IMPRESSION: Negative.

## 2022-02-07 ENCOUNTER — Emergency Department (HOSPITAL_BASED_OUTPATIENT_CLINIC_OR_DEPARTMENT_OTHER)
Admission: EM | Admit: 2022-02-07 | Discharge: 2022-02-07 | Disposition: A | Discharge status: Home / Self Care | Payer: BC Managed Care – PPO | Attending: Emergency Medicine | Admitting: Emergency Medicine

## 2022-02-07 ENCOUNTER — Other Ambulatory Visit: Payer: Self-pay

## 2022-02-07 ENCOUNTER — Encounter (HOSPITAL_BASED_OUTPATIENT_CLINIC_OR_DEPARTMENT_OTHER): Payer: Self-pay | Admitting: Emergency Medicine

## 2022-02-07 DIAGNOSIS — R519 Headache, unspecified: Secondary | ICD-10-CM | POA: Diagnosis not present

## 2022-02-07 MED ORDER — SODIUM CHLORIDE 0.9 % IV BOLUS
1000.0000 mL | Freq: Once | INTRAVENOUS | Status: AC
  Administered 2022-02-07: 1000 mL via INTRAVENOUS

## 2022-02-07 MED ORDER — METOCLOPRAMIDE HCL 5 MG/ML IJ SOLN
10.0000 mg | Freq: Once | INTRAMUSCULAR | Status: AC
  Administered 2022-02-07: 10 mg via INTRAVENOUS
  Filled 2022-02-07: qty 2

## 2022-02-07 MED ORDER — KETOROLAC TROMETHAMINE 15 MG/ML IJ SOLN
15.0000 mg | Freq: Once | INTRAMUSCULAR | Status: AC
  Administered 2022-02-07: 15 mg via INTRAVENOUS
  Filled 2022-02-07: qty 1

## 2022-02-07 NOTE — ED Triage Notes (Signed)
Pt arrives to ED with c/o intermittent headaches x1 week. Hx migraines, no abortive/prophylactic medications used.

## 2022-02-07 NOTE — ED Notes (Signed)
Pt called out, this nurse to the room. Pt panicking states she feels like she is having a panic attack. Pt standing at bedside begging for her IV to be taken out so she could leave. This nurse explained that it was an effect of one of the medications she had received and that the feeling would pass soon. Pt willing to try and stay.

## 2022-02-07 NOTE — ED Provider Notes (Signed)
[facility] Provider Note   CSN: [id number] Arrival date & time: 02/07/22  1053     History  Chief Complaint  Patient presents with   Headache    Elliyah Liszewski is a 37 y.o. female.   Headache    This is a 37 year old female with history of migraines presenting to the emergency department due to headache.  Started a week ago, its constant but the severity waxes and wanes throughout the day.  Improves somewhat with Tylenol Motrin but never fully resolves.  Worsens with light, she has nausea but no vomiting.  There is no lateralized weakness or numbness, chest pain, shortness of breath, vision changes.  Home Medications Prior to Admission medications   Medication Sig Start Date End Date Taking? Authorizing Provider  fluticasone (FLONASE) 50 MCG/ACT nasal spray Place 1 spray into both nostrils daily. 04/11/19   Hall-Potvin, [place], PA-C  sertraline (ZOLOFT) 100 MG tablet Take 150 mg by mouth daily.    [provider]      Allergies    Patient has no known allergies.    Review of Systems   Review of Systems  Neurological:  Positive for headaches.    Physical Exam Updated Vital Signs BP 139/73 (BP Location: Left Arm)   Pulse 85   Temp 98.7 F (37.1 C) (Oral)   Resp 18   Ht 5\' 4"  (1.626 m)   Wt (!) 148 kg   SpO2 100%   BMI 56.01 kg/m  Physical Exam Vitals and nursing note reviewed. Exam conducted with a chaperone present.  Constitutional:      Appearance: Normal appearance.  HENT:     Head: Normocephalic and atraumatic.  Eyes:     General: No scleral icterus.       Right eye: No discharge.        Left eye: No discharge.     Extraocular Movements: Extraocular movements intact.     Right eye: Normal extraocular motion.     Left eye: Normal extraocular motion.     Pupils: Pupils are equal, round, and reactive to light.     Comments: Clear disc margins, do not appreciate any papilledema  Cardiovascular:      Rate and Rhythm: Normal rate and regular rhythm.     Pulses: Normal pulses.     Heart sounds: Normal heart sounds. No murmur heard.    No friction rub. No gallop.  Pulmonary:     Effort: Pulmonary effort is normal. No respiratory distress.     Breath sounds: Normal breath sounds.  Abdominal:     General: Abdomen is flat. Bowel sounds are normal. There is no distension.     Palpations: Abdomen is soft.     Tenderness: There is no abdominal tenderness.  Skin:    General: Skin is warm and dry.     Coloration: Skin is not jaundiced.  Neurological:     Mental Status: She is alert. Mental status is at baseline.     GCS: GCS eye subscore is 4. GCS verbal subscore is 5. GCS motor subscore is 6.     Cranial Nerves: No cranial nerve deficit.     Coordination: Coordination normal.     Comments: Cranial nerves II to XII are intact.  No pronator drift, normal finger-nose, ambulatory steady gait.     ED Results / Procedures / Treatments   Labs (all labs ordered are listed, but only abnormal results are displayed) Labs Reviewed - No data  to display  EKG None  Radiology No results found.  Procedures Procedures    Medications Ordered in ED Medications  ketorolac (TORADOL) 15 MG/ML injection 15 mg (has no administration in time range)  metoCLOPramide (REGLAN) injection 10 mg (has no administration in time range)  sodium chloride 0.9 % bolus 1,000 mL (has no administration in time range)    ED Course/ Medical Decision Making/ A&P                             Medical Decision Making Risk Prescription drug management.   This is a 37 year old female with history of migraines presents to the emergency department due to headache.  On exam she is very well-appearing without any appreciable focal deficits.  Does not appear consistent with a TIA or CVA.  I considered optic neuritis, trigeminal neuralgia, GCA, SAH but do not feel likely based on presentation and reassuring exam and history.   Will proceed with migraine cocktail and reevaluate.  Do not think we need imaging or labs at this time based on history of headaches are similar to this.  Engaged in shared decision making with patient who does not wish for a CT scan at this time and I think that is reasonable.  I reevaluated the patient who feels improved on reevaluation.  Stable for discharge at this time.        Final Clinical Impression(s) / ED Diagnoses Final diagnoses:  None    Rx / DC Orders ED Discharge Orders     None         Sherrill Raring, [place] 02/07/22 1658    Tegeler, Gwenyth Allegra, MD 02/08/22 [phone]

## 2022-02-07 NOTE — Discharge Instructions (Signed)
Take excedrin as needed for headache. Follow up with PCP if headaches persist. Return to ED for severe sudden onset, vision changes, lateralized weakness, fevers, no or concerning symptoms

## 2022-02-07 NOTE — ED Notes (Signed)
Discharge instructions, pain management and follow up care reviewed and explained, pt verbalized understanding and had no further questions on d/c.

## 2023-04-25 ENCOUNTER — Other Ambulatory Visit: Payer: Self-pay

## 2023-04-25 ENCOUNTER — Encounter: Payer: Self-pay | Admitting: Allergy & Immunology

## 2023-04-25 ENCOUNTER — Ambulatory Visit (INDEPENDENT_AMBULATORY_CARE_PROVIDER_SITE_OTHER): Admitting: Allergy & Immunology

## 2023-04-25 VITALS — BP 130/72 | HR 82 | Temp 97.6°F | Resp 16 | Ht 65.75 in | Wt 333.5 lb

## 2023-04-25 DIAGNOSIS — J310 Chronic rhinitis: Secondary | ICD-10-CM

## 2023-04-25 DIAGNOSIS — J4520 Mild intermittent asthma, uncomplicated: Secondary | ICD-10-CM | POA: Diagnosis not present

## 2023-04-25 DIAGNOSIS — R0989 Other specified symptoms and signs involving the circulatory and respiratory systems: Secondary | ICD-10-CM | POA: Diagnosis not present

## 2023-04-25 DIAGNOSIS — J387 Other diseases of larynx: Secondary | ICD-10-CM | POA: Diagnosis not present

## 2023-04-25 MED ORDER — IPRATROPIUM BROMIDE 0.03 % NA SOLN: 2.0000 | Freq: Three times a day (TID) | NASAL | 5 refills | Status: AC | PRN

## 2023-04-25 NOTE — Patient Instructions (Addendum)
 1. Throat clearing and chronic rhinitis - Because of insurance stipulations, we cannot do skin testing on the same day as your first visit. - We are all working to fight this, but for now we need to do two separate visits.  - We will know more after we do testing at the next visit.  - The skin testing visit can be squeezed in at your convenience.  - Then we can make a more full plan to address all of your symptoms. - Be sure to stop your antihistamines for 3 days before this appointment.  - In the meantime, start Atrovent  (ipratropium) one spray per nostril up to three times daily (CAN OVER OVER DRYING).   2. Concern for asthma - Lung testing looked good from an asthma perspective.  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 - We can send you to see a special ENT for this with speech therapy, but I am not sure that this is worth it. - There is a LOT of overlap with anxiety as well.  - BUT I do not think that you need an inhaler at this point in time.   3. Return in about 1 week (around 05/02/2023) for SKIN TESTING (1-55).Aaron Aas You can have the follow up appointment with Dr. Idolina Maker or a Nurse Practicioner (our Nurse Practitioners are excellent and always have Physician oversight!).    Please inform us  of any Emergency Department visits, hospitalizations, or changes in symptoms. Call us  before going to the ED for breathing or allergy symptoms since we might be able to fit you in for a sick visit. Feel free to contact us  anytime with any questions, problems, or concerns.  It was a pleasure to meet you today!  Websites that have reliable patient information: 1. American Academy of Asthma, Allergy, and Immunology: www.aaaai.org 2. Food Allergy Research and Education (FARE): foodallergy.org 3. Mothers of Asthmatics: http://www.asthmacommunitynetwork.org 4. American College of Allergy, Asthma, and Immunology: www.acaai.org      "Like"  us  on Facebook and Instagram for our latest updates!      A healthy democracy works best when [employer] participate! Make sure you are registered to vote! If you have moved or changed any of your contact information, you will need to get this updated before voting! Scan the QR codes below to learn more!

## 2023-04-25 NOTE — Progress Notes (Signed)
 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 Throat clearing and chronic rhinitis - Because of insurance stipulations, we cannot do skin testing on the same day as your first visit. - We are all working to fight this, but for now we need to do two separate visits.  - We will know more after we do testing at the next visit.  - The skin testing visit can be squeezed in at your convenience.  - Then we can make a more full plan to address all of your symptoms. - Be sure to stop your antihistamines for 3 days before this appointment.  - In the meantime, start Atrovent  (ipratropium) one spray per nostril up to three times daily (CAN OVER OVER DRYING).   2. Concern for asthma - Lung testing looked good from an asthma perspective.  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 - We can send you to see a special ENT for this with speech therapy, but I am not sure that this is worth it. - There is a LOT of overlap with anxiety as well.  - BUT I do not think that you need an inhaler at this point in time.  - Breathing exercises provided for VCD.   3. Return in about 1 week (around 05/02/2023) for SKIN TESTING (1-55).Laura Rubio You can have the follow up appointment with Dr. Idolina Maker or a Nurse Practicioner (our Nurse Practitioners are excellent and always have Physician oversight!).   This note in its entirety was forwarded to the Provider who requested this consultation.  Subjective:   Laura Rubio is a 38 y.o. female presenting today for evaluation of  Chief Complaint  Patient presents with   Allergies   PND   Cough   Nasal Congestion    Laura Rubio has a history of the following: There are no  active problems to display for this patient.   History obtained from: chart review and patient.  Discussed the use of AI scribe software for clinical note transcription with the patient and/or guardian, who gave verbal consent to proceed.  Laura Rubio was referred by Patient, No Pcp Per.     Laura Rubio is a 38 y.o. female presenting for an evaluation of cough and chronic rhinitis .  She experiences a persistent cough described as a 'tickle in the throat', which she attributes to allergies. The cough is intermittent, with episodes intense enough to cause gagging, and is particularly bothersome at night, affecting her sleep. She sometimes coughs up clear mucus, while at other times the cough is dry. No issues with food or runny nose. Occasional relief from albuterol, but no history of asthma. Coughing spells have been severe enough to cause urinary incontinence.  She has a history of sinus issues and underwent a sinus CT and nasal scope by an ENT. The sinus CT was normal except for a minimal leftward deviation of the septum. The nasal scope showed a lot of clear fluid behind her nose, but no signs of acid reflux. She has not had allergy testing done before.  She has tried various nasal sprays, including fluticasone , but has not used ipratropium or Atrovent . She has experienced some relief from albuterol during severe coughing spells. She has a history of upper respiratory infections  and bronchitis, particularly in 2021, and had COVID-19 following these infections.  She lives with her aunt in a house over twenty years old, in a room that is dry and hot. There is some visible mold in the bathroom, and the air filters may not be changed regularly. She has lived with her aunt for about four years, which coincides with the worsening of her symptoms. 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 Her aunt has asthma.   She does have a history of  depression and anxiety. She works and then mostly goes home. She is not exposed to anything out of the ordinary during her daily life.      IMPRESSION:  1. Normally aerated paranasal sinuses. Patent sinus drainage pathways.  2. Minimal leftward deviation of the bony nasal septum inferiorly.  3. Asymmetric prominence of the right middle and inferior nasal turbinates, possibly reflecting the nasal cycle.   Otherwise, there is no history of other atopic diseases, including drug allergies, stinging insect allergies, or contact dermatitis. There is no significant infectious history. Vaccinations are up to date.    Past Medical History: There are no active problems to display for this patient.   Medication List:  Allergies as of 04/25/2023   No Known Allergies      Medication List        Accurate as of April 25, 2023 12:06 PM. If you have any questions, ask your nurse or doctor.          Allergy Relief 180 MG tablet Generic drug: fexofenadine Take 180 mg by mouth daily.   benzonatate 100 MG capsule Commonly known as: TESSALON Take 100 mg by mouth daily as needed for cough.   fluticasone  50 MCG/ACT nasal spray Commonly known as: FLONASE  Place 1 spray into both nostrils daily.   ipratropium 0.03 % nasal spray Commonly known as: ATROVENT  Place 2 sprays into both nostrils 3 (three) times daily as needed for rhinitis. Started by: Rochester Chuck   montelukast 10 MG tablet Commonly known as: SINGULAIR Take 1 tablet by mouth at bedtime.   sertraline 100 MG tablet Commonly known as: ZOLOFT Take 150 mg by mouth daily.        Birth History: non-contributory  Developmental History: non-contributory  Past Surgical History: History reviewed. No pertinent surgical history.   Family History: Family History  Problem Relation Age of Onset   Stroke Mother    Eczema Sister    Asthma Maternal Aunt    Cancer Maternal Grandmother    Asthma Cousin    Allergic  rhinitis Neg Hx    Angioedema Neg Hx    Urticaria Neg Hx      Social History: Laura Rubio lives at home with her aunt. There is no longer a dog in the home. She works as a Engineer, structural. There is no smoking in the home.    Review of systems otherwise negative other than that mentioned in the HPI.    Objective:   Blood pressure 130/72, pulse 82, temperature 97.6 F (36.4 C), temperature source Temporal, resp. rate 16, height 5' 5.75" (1.67 m), weight (!) 333 lb 8 oz (151.3 kg), SpO2 98%. Body mass index is 54.24 kg/m.     Physical Exam Vitals reviewed.  Constitutional:      Appearance: She is well-developed. She is obese.  HENT:     Head: Normocephalic and atraumatic.     Right Ear: Tympanic membrane, ear canal and external  ear normal. No drainage, swelling or tenderness. Tympanic membrane is not injected, scarred, erythematous, retracted or bulging.     Left Ear: Tympanic membrane, ear canal and external ear normal. No drainage, swelling or tenderness. Tympanic membrane is not injected, scarred, erythematous, retracted or bulging.     Nose: No nasal deformity, septal deviation, mucosal edema or rhinorrhea.     Right Turbinates: Enlarged, swollen and pale.     Left Turbinates: Enlarged, swollen and pale.     Right Sinus: No maxillary sinus tenderness or frontal sinus tenderness.     Left Sinus: No maxillary sinus tenderness or frontal sinus tenderness.     Mouth/Throat:     Lips: Pink.     Mouth: Mucous membranes are moist. Mucous membranes are pale and not dry.     Pharynx: Uvula midline.  Eyes:     General:        Right eye: No discharge.        Left eye: No discharge.     Conjunctiva/sclera: Conjunctivae normal.     Right eye: Right conjunctiva is not injected. No chemosis.    Left eye: Left conjunctiva is not injected. No chemosis.    Pupils: Pupils are equal, round, and reactive to light.  Cardiovascular:     Rate and Rhythm: Normal rate and regular rhythm.      Heart sounds: Normal heart sounds.  Pulmonary:     Effort: Pulmonary effort is normal. No tachypnea, accessory muscle usage or respiratory distress.     Breath sounds: Normal breath sounds. No wheezing, rhonchi or rales.     Comments: Moving air well in all lung fields.  No increased work of breathing. Chest:     Chest wall: No tenderness.  Abdominal:     Tenderness: There is no abdominal tenderness. There is no guarding or rebound.  Lymphadenopathy:     Head:     Right side of head: No submandibular, tonsillar or occipital adenopathy.     Left side of head: No submandibular, tonsillar or occipital adenopathy.     Cervical: No cervical adenopathy.  Skin:    Coloration: Skin is not pale.     Findings: No abrasion, erythema, petechiae or rash. Rash is not papular, urticarial or vesicular.  Neurological:     Mental Status: She is alert.     Diagnostic studies:    Spirometry: results normal (FEV1: 2.46/89%, FVC: 2.84/86%, FEV1/FVC: 87%).    Spirometry consistent with normal pattern.  However, the inspiratory loop is flattened suggestive of inducible laryngeal obstruction.    Allergy Studies: deferred due to insurance stipulations that require a separate visit for testing             Drexel Gentles, MD [facility] 

## 2023-07-04 ENCOUNTER — Ambulatory Visit (INDEPENDENT_AMBULATORY_CARE_PROVIDER_SITE_OTHER): Admitting: Allergy & Immunology

## 2023-07-04 ENCOUNTER — Encounter: Payer: Self-pay | Admitting: Allergy & Immunology

## 2023-07-04 DIAGNOSIS — J302 Other seasonal allergic rhinitis: Secondary | ICD-10-CM | POA: Diagnosis not present

## 2023-07-04 DIAGNOSIS — J3089 Other allergic rhinitis: Secondary | ICD-10-CM

## 2023-07-04 DIAGNOSIS — J387 Other diseases of larynx: Secondary | ICD-10-CM

## 2023-07-04 MED ORDER — MONTELUKAST SODIUM 10 MG PO TABS: 10.0000 mg | ORAL_TABLET | Freq: Every day | ORAL | 1 refills | Status: AC

## 2023-07-04 MED ORDER — LEVOCETIRIZINE DIHYDROCHLORIDE 5 MG PO TABS: 5.0000 mg | ORAL_TABLET | Freq: Every evening | ORAL | 1 refills | Status: AC

## 2023-07-04 NOTE — Progress Notes (Signed)
 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 Throat clearing and chronic rhinitis - Testing today showed: grasses, ragweed, trees, dust mites, cat, and cockroach - Copy of test results provided.  - Avoidance measures provided. - Stop taking: Claritin - Continue with: Singulair (montelukast) 10mg  daily and Atrovent  (ipratropium) 0.03% one spray per nostril 2-3 times daily as needed (CAN BE OVER DRYING) - Start taking: Xyzal (levocetirizine) 5mg  tablet once daily - You can use an extra dose of the antihistamine, if needed, for breakthrough symptoms.  - Consider nasal saline rinses 1-2 times daily to remove allergens from the nasal cavities as well as help with mucous clearance (this is especially helpful to do before the nasal sprays are given) - Consider allergy shots as a means of long-term control. - Allergy shots CURE ALLERGIES by re-training and resetting the immune system to ignore environmental allergens and decrease the resulting immune response to those allergens (sneezing, itchy watery eyes, runny nose, nasal congestion, etc).    - Allergy shots improve symptoms in 75-85% of patients.  - We can discuss more at the next appointment if the medications are not working for you.  2. Concern for asthma - Continue with your voice exercises from your aunt!  - How cool that you have a speech pathologist in your family!   3. Return in about 3 months (around 10/04/2023). You can have the follow up appointment with Dr. Iva or a Nurse Practicioner (our Nurse Practitioners are excellent and always have Physician oversight!).     Subjective:   Laura Rubio is a 38 y.o. 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  Discussed the use of AI scribe software for clinical note transcription with the patient and/or guardian, who gave verbal consent to proceed.  Laura Rubio is a 38 y.o. female presenting for skin testing. She was last seen on April 22nd. We could not do testing because her insurance company does not cover testing on the same day as a New Patient visit. She has been off of all antihistamines 3 days in anticipation of the testing.   At that visit, we decided to do environmental allergy testing due to chronic throat clearing.  We started her on ipratropium 1 spray per nostril up to 3 times daily.  With her concern for asthma, we did lung testing which looked fairly good, but she did have a flattened inspiratory loop consistent with vocal cord dysfunction.  We gave her information on VCD and breathing exercises.  Since last visit, she has done well.  Otherwise, there have been no changes to her past medical history, surgical history, family history, or social history.    Review of systems otherwise negative other than that mentioned in the HPI.    Objective:   There were no vitals taken for this visit. There is no height or weight on file to calculate BMI.    Physical exam deferred since this  was a skin testing appointment only.   Diagnostic studies:   Allergy Studies:     Airborne Adult Perc - 07/04/23 0856     Time Antigen Placed 0856    Allergen Manufacturer Jestine    Location Back    Number of Test 55    Panel 1 Select    1. Control-Buffer 50% Glycerol Negative    2. Control-Histamine 2+    3. Bahia Negative    4. French Southern Territories Negative    5. Johnson  Negative    6. [place]  Blue Negative    7. Meadow Fescue Negative    8. Perennial Rye Negative    9. Timothy Negative    10. Ragweed Mix Negative    11. Cocklebur Negative    12. Plantain,  English Negative    13. Baccharis Negative    14. Dog Fennel Negative    15. Russian Thistle Negative    16. Lamb's Quarters Negative    17. Sheep Sorrell Negative    18. Rough Pigweed Negative    19. Marsh Elder, Rough Negative    20. Mugwort, Common Negative    21. Box, Elder Negative    22. Cedar, red Negative    23. Sweet Gum Negative    24. Pecan Pollen Negative    25. Pine Mix Negative    26. Walnut, Black Pollen Negative    27. Red Mulberry Negative    28. Ash Mix Negative    29. Birch Mix Negative    30. Beech American Negative    31. Cottonwood, Guinea[place]iss[place] Negative    32. Hickory, White Negative    33. Maple Mix Negative    34. Oak, [place] Mix Negative    35. Sycamore Eastern Negative    36. Alternaria Alternata Negative    37. Cladosporium Herbarum Negative    38. Aspergillus Mix Negative    39. Penicillium Mix Negative    40. Bipolaris Sorokiniana (Helminthosporium) Negative    41. Drechslera Spicifera (Curvularia) Negative    42. Mucor Plumbeus Negative    43. Fusarium Moniliforme Negative    44. Aureobasidium Pullulans (pullulara) Negative    45. Rhizopus Oryzae Negative    46. Botrytis Cinera Negative    47. Epicoccum Nigrum Negative    48. Phoma Betae Negative    49. Dust Mite Mix 3+    50. Cat Hair 10,000 BAU/ml Negative    51.  Dog Epithelia Negative    52. Mixed Feathers Negative    53. Horse Epithelia Negative    54. Cockroach, German Negative    55. Tobacco Leaf Negative          Intradermal - 07/04/23 0928     Time Antigen Placed 0930    Allergen Manufacturer Jestine    Location Arm    Number of Test 15    Control Negative    Bahia 2+    French Southern Territories Negative    Johnson 2+    7 Grass Negative    Ragweed Mix 4+    Weed Mix Negative    Tree Mix 2+     Mold 1 Negative    Mold 2 Negative    Mold 3 Negative    Mold 4 Negative    Cat 3+    Dog Negative    Cockroach 3+          Allergy testing results were read and interpreted by myself, documented by clinical staff.      Laura Shaggy, MD  [facility] 

## 2023-07-04 NOTE — Patient Instructions (Addendum)
 1. Throat clearing and chronic rhinitis - Testing today showed: grasses, ragweed, trees, dust mites, cat, and cockroach - Copy of test results provided.  - Avoidance measures provided. - Stop taking: Claritin - Continue with: Singulair (montelukast) 10mg  daily and Atrovent  (ipratropium) 0.03% one spray per nostril 2-3 times daily as needed (CAN BE OVER DRYING) - Start taking: Xyzal (levocetirizine) 5mg  tablet once daily - You can use an extra dose of the antihistamine, if needed, for breakthrough symptoms.  - Consider nasal saline rinses 1-2 times daily to remove allergens from the nasal cavities as well as help with mucous clearance (this is especially helpful to do before the nasal sprays are given) - Consider allergy shots as a means of long-term control. - Allergy shots CURE ALLERGIES by re-training and resetting the immune system to ignore environmental allergens and decrease the resulting immune response to those allergens (sneezing, itchy watery eyes, runny nose, nasal congestion, etc).    - Allergy shots improve symptoms in 75-85% of patients.  - We can discuss more at the next appointment if the medications are not working for you.  2. Concern for asthma - Continue with your voice exercises from your aunt!  - How cool that you have a speech pathologist in your family!   3. Return in about 3 months (around 10/04/2023). You can have the follow up appointment with Dr. Iva or a Nurse Practicioner (our Nurse Practitioners are excellent and always have Physician oversight!).    Please inform us  of any Emergency Department visits, hospitalizations, or changes in symptoms. Call us  before going to the ED for breathing or allergy symptoms since we might be able to fit you in for a sick visit. Feel free to contact us  anytime with any questions, problems, or concerns.  It was a pleasure to meet you today!  Websites that have reliable patient information: 1. American Academy of  Asthma, Allergy, and Immunology: www.aaaai.org 2. Food Allergy Research and Education (FARE): foodallergy.org 3. Mothers of Asthmatics: http://www.asthmacommunitynetwork.org 4. American College of Allergy, Asthma, and Immunology: www.acaai.org      "Like" us  on Facebook and Instagram for our latest updates!      A healthy democracy works best when [employer] participate! Make sure you are registered to vote! If you have moved or changed any of your contact information, you will need to get this updated before voting! Scan the QR codes below to learn more!        Airborne Adult Perc - 07/04/23 0856     Time Antigen Placed 0856    Allergen Manufacturer Jestine    Location Back    Number of Test 55    Panel 1 Select    1. Control-Buffer 50% Glycerol Negative    2. Control-Histamine 2+    3. Bahia Negative    4. [place] Territ[place] Negative    5. Johnson Negative    6. [place]  Blue Negative    7. Meadow Fescue Negative    8. Perennial Rye Negative    9. Timothy Negative    10. Ragweed Mix Negative    11. Cocklebur Negative    12. Plantain,  English Negative    13. Baccharis Negative    14. Dog Fennel Negative    15. Russian Thistle Negative    16. Lamb's Quarters Negative    17. Sheep Sorrell Negative    18. Rough Pigweed Negative    19. Marsh Elder, Rough Negative    20. Mugwort, Common Negative  21. Box, Elder Negative    22. Cedar, red Negative    23. Sweet Gum Negative    24. Pecan Pollen Negative    25. Pine Mix Negative    26. Walnut, Black Pollen Negative    27. Red Mulberry Negative    28. Ash Mix Negative    29. Birch Mix Negative    30. Beech American Negative    31. Cottonwood, Guinea-[place]iss[place] Negative    32. Hickory, White Negative    33. Maple Mix Negative    34. Oak, [place] Mix Negative    35. Sycamore Eastern Negative    36. Alternaria Alternata Negative    37. Cladosporium Herbarum Negative    38. Aspergillus Mix Negative    39. Penicillium Mix Negative     40. Bipolaris Sorokiniana (Helminthosporium) Negative    41. Drechslera Spicifera (Curvularia) Negative    42. Mucor Plumbeus Negative    43. Fusarium Moniliforme Negative    44. Aureobasidium Pullulans (pullulara) Negative    45. Rhizopus Oryzae Negative    46. Botrytis Cinera Negative    47. Epicoccum Nigrum Negative    48. Phoma Betae Negative    49. Dust Mite Mix 3+    50. Cat Hair 10,000 BAU/ml Negative    51.  Dog Epithelia Negative    52. Mixed Feathers Negative    53. Horse Epithelia Negative    54. Cockroach, German Negative    55. Tobacco Leaf Negative          Intradermal - 07/04/23 0928     Time Antigen Placed 0930    Allergen Manufacturer Jestine    Location Arm    Number of Test 15    Control Negative    Bahia 2+    French Southern Territories Negative    Johnson 2+    7 Grass Negative    Ragweed Mix 4+    Weed Mix Negative    Tree Mix 2+    Mold 1 Negative    Mold 2 Negative    Mold 3 Negative    Mold 4 Negative    Cat 3+    Dog Negative    Cockroach 3+          Reducing Pollen Exposure  The American Academy of Allergy, Asthma and Immunology suggests the following steps to reduce your exposure to pollen during allergy seasons.    Do not hang sheets or clothing out to dry; pollen may collect on these items. Do not mow lawns or spend time around freshly cut grass; mowing stirs up pollen. Keep windows closed at night.  Keep car windows closed while driving. Minimize morning activities outdoors, a time when pollen counts are usually at their highest. Stay indoors as much as possible when pollen counts or humidity is high and on windy days when pollen tends to remain in the air longer. Use air conditioning when possible.  Many air conditioners have filters that trap the pollen spores. Use a HEPA room air filter to remove pollen form the indoor air you breathe.  Control of Dog or Cat Allergen  Avoidance is the best way to manage a dog or cat allergy. If you have a dog  or cat and are allergic to dog or cats, consider removing the dog or cat from the home. If you have a dog or cat but don't want to find it a new home, or if your family wants a pet even though someone in the household is allergic, here  are some strategies that may help keep symptoms at bay:  Keep the pet out of your bedroom and restrict it to only a few rooms. Be advised that keeping the dog or cat in only one room will not limit the allergens to that room. Don't pet, hug or kiss the dog or cat; if you do, wash your hands with soap and water. High-efficiency particulate air (HEPA) cleaners run continuously in a bedroom or living room can reduce allergen levels over time. Regular use of a high-efficiency vacuum cleaner or a central vacuum can reduce allergen levels. Giving your dog or cat a bath at least once a week can reduce airborne allergen.  Control of Dust Mite Allergen    Dust mites play a major role in allergic asthma and rhinitis.  They occur in environments with high humidity wherever human skin is found.  Dust mites absorb humidity from the atmosphere (ie, they do not drink) and feed on organic matter (including shed human and animal skin).  Dust mites are a microscopic type of insect that you cannot see with the naked eye.  High levels of dust mites have been detected from mattresses, pillows, carpets, upholstered furniture, bed covers, clothes, soft toys and any woven material.  The principal allergen of the dust mite is found in its feces.  A gram of dust may contain 1,000 mites and 250,000 fecal particles.  Mite antigen is easily measured in the air during house cleaning activities.  Dust mites do not bite and do not cause harm to humans, other than by triggering allergies/asthma.    Ways to decrease your exposure to dust mites in your home:  Encase mattresses, box springs and pillows with a mite-impermeable barrier or cover   Wash sheets, blankets and drapes weekly in hot water (130  F) with detergent and dry them in a dryer on the hot setting.  Have the room cleaned frequently with a vacuum cleaner and a damp dust-mop.  For carpeting or rugs, vacuuming with a vacuum cleaner equipped with a high-efficiency particulate air (HEPA) filter.  The dust mite allergic individual should not be in a room which is being cleaned and should wait 1 hour after cleaning before going into the room. Do not sleep on upholstered furniture (eg, couches).   If possible removing carpeting, upholstered furniture and drapery from the home is ideal.  Horizontal blinds should be eliminated in the rooms where the person spends the most time (bedroom, study, television room).  Washable vinyl, roller-type shades are optimal. Remove all non-washable stuffed toys from the bedroom.  Wash stuffed toys weekly like sheets and blankets above.   Reduce indoor humidity to less than 50%.  Inexpensive humidity monitors can be purchased at most hardware stores.  Do not use a humidifier as can make the problem worse and are not recommended.  Control of Cockroach Allergen  Cockroach allergen has been identified as an important cause of acute attacks of asthma, especially in urban settings.  There are fifty-five species of cockroach that exist in the [place] , however only three, the [place], [place] and [place] species produce allergen that can affect patients with Asthma.  Allergens can be obtained from fecal particles, egg casings and secretions from cockroaches.    Remove food sources. Reduce access to water. Seal access and entry points. Spray runways with 0.5-1% Diazinon or Chlorpyrifos Blow boric acid power under stoves and refrigerator. Place bait stations (hydramethylnon) at feeding sites.  Allergy Shots  Allergies are the result  of a chain reaction that starts in the immune system. Your immune system controls how your body defends itself. For instance, if you have an allergy to pollen, your immune system  identifies pollen as an invader or allergen. Your immune system overreacts by producing antibodies called Immunoglobulin E (IgE). These antibodies travel to cells that release chemicals, causing an allergic reaction.  The concept behind allergy immunotherapy, whether it is received in the form of shots or tablets, is that the immune system can be desensitized to specific allergens that trigger allergy symptoms. Although it requires time and patience, the payback can be long-term relief. Allergy injections contain a dilute solution of those substances that you are allergic to based upon your skin testing and allergy history.   How Do Allergy Shots Work?  Allergy shots work much like a vaccine. Your body responds to injected amounts of a particular allergen given in increasing doses, eventually developing a resistance and tolerance to it. Allergy shots can lead to decreased, minimal or no allergy symptoms.  There generally are two phases: build-up and maintenance. Build-up often ranges from three to six months and involves receiving injections with increasing amounts of the allergens. The shots are typically given once or twice a week, though more rapid build-up schedules are sometimes used.  The maintenance phase begins when the most effective dose is reached. This dose is different for each person, depending on how allergic you are and your response to the build-up injections. Once the maintenance dose is reached, there are longer periods between injections, typically two to four weeks.  Occasionally doctors give cortisone-type shots that can temporarily reduce allergy symptoms. These types of shots are different and should not be confused with allergy immunotherapy shots.  Who Can Be Treated with Allergy Shots?  Allergy shots may be a good treatment approach for people with allergic rhinitis (hay fever), allergic asthma, conjunctivitis (eye allergy) or stinging insect allergy.   Before deciding to  begin allergy shots, you should consider:   The length of allergy season and the severity of your symptoms  Whether medications and/or changes to your environment can control your symptoms  Your desire to avoid long-term medication use  Time: allergy immunotherapy requires a major time commitment  Cost: may vary depending on your insurance coverage  Allergy shots for children age 76 and older are effective and often well tolerated. They might prevent the onset of new allergen sensitivities or the progression to asthma.  Allergy shots are not started on patients who are pregnant but can be continued on patients who become pregnant while receiving them. In some patients with other medical conditions or who take certain common medications, allergy shots may be of risk. It is important to mention other medications you talk to your allergist.   What are the two types of build-ups offered:   RUSH or Rapid Desensitization -- one day of injections lasting from 8:30-4:30pm, injections every 1 hour.  Approximately half of the build-up process is completed in that one day.  The following week, normal build-up is resumed, and this entails ~16 visits either weekly or twice weekly, until reaching your "maintenance dose" which is continued weekly until eventually getting spaced out to every month for a duration of 3 to 5 years. The regular build-up appointments are nurse visits where the injections are administered, followed by required monitoring for 30 minutes.    Traditional build-up -- weekly visits for 6 -12 months until reaching "maintenance dose", then continue weekly until eventually  spacing out to every 4 weeks as above. At these appointments, the injections are administered, followed by required monitoring for 30 minutes.     Either way is acceptable, and both are equally effective. With the rush protocol, the advantage is that less time is spent here for injections overall AND you would also reach  maintenance dosing faster (which is when the clinical benefit starts to become more apparent). Not everyone is a candidate for rapid desensitization.   IF we proceed with the RUSH protocol, there are premedications which must be taken the day before and the day after the rush only (this includes antihistamines, steroids, and Singulair).  After the rush day, no prednisone or Singulair is required, and we just recommend antihistamines taken on your injection day.  What Is An Estimate of the Costs?  If you are interested in starting allergy injections, please check with your insurance company about your coverage for both allergy vial sets and allergy injections.  Please do so prior to making the appointment to start injections.  The following are CPT codes to give to your insurance company. These are the amounts we BILL to the insurance company, but the amount YOU WILL PAY and WE RECEIVE IS SUBSTANTIALLY LESS and depends on the contracts we have with different insurance companies.   Amount Billed to Insurance One allergy vial set  CPT 95165   $ 1200     Two allergy vial set  CPT 95165   $ 2400     Three allergy vial set  CPT 95165   $ 3600     One injection   CPT 95115   $ 35  Two injections   CPT 95117   $ 40 RUSH (Rapid Desensitization) CPT 95180 x 8 hours $500/hour  Regarding the allergy injections, your co-pay may or may not apply with each injection, so please confirm this with your insurance company. When you start allergy injections, 1 or 2 sets of vials are made based on your allergies.  Not all patients can be on one set of vials. A set of vials lasts 6 months to a year depending on how quickly you can proceed with your build-up of your allergy injections. Vials are personalized for each patient depending on their specific allergens.  How often are allergy injection given during the build-up period?   Injections are given at least weekly during the build-up period until your maintenance  dose is achieved. Per the doctor's discretion, you may have the option of getting allergy injections two times per week during the build-up period. However, there must be at least 48 hours between injections. The build-up period is usually completed within 6-12 months depending on your ability to schedule injections and for adjustments for reactions. When maintenance dose is reached, your injection schedule is gradually changed to every two weeks and later to every three weeks. Injections will then continue every 4 weeks. Usually, injections are continued for a total of 3-5 years.   When Will I Feel Better?  Some may experience decreased allergy symptoms during the build-up phase. For others, it may take as long as 12 months on the maintenance dose. If there is no improvement after a year of maintenance, your allergist will discuss other treatment options with you.  If you aren't responding to allergy shots, it may be because there is not enough dose of the allergen in your vaccine or there are missing allergens that were not identified during your allergy testing. Other reasons could  be that there are high levels of the allergen in your environment or major exposure to non-allergic triggers like tobacco smoke.  What Is the Length of Treatment?  Once the maintenance dose is reached, allergy shots are generally continued for three to five years. The decision to stop should be discussed with your allergist at that time. Some people may experience a permanent reduction of allergy symptoms. Others may relapse and a longer course of allergy shots can be considered.  What Are the Possible Reactions?  The two types of adverse reactions that can occur with allergy shots are local and systemic. Common local reactions include very mild redness and swelling at the injection site, which can happen immediately or several hours after. Report a delayed reaction from your last injection. These include arm swelling or  runny nose, watery eyes or cough that occurs within 12-24 hours after injection. A systemic reaction, which is less common, affects the entire body or a particular body system. They are usually mild and typically respond quickly to medications. Signs include increased allergy symptoms such as sneezing, a stuffy nose or hives.   Rarely, a serious systemic reaction called anaphylaxis can develop. Symptoms include swelling in the throat, wheezing, a feeling of tightness in the chest, nausea or dizziness. Most serious systemic reactions develop within 30 minutes of allergy shots. This is why it is strongly recommended you wait in your doctor's office for 30 minutes after your injections. Your allergist is trained to watch for reactions, and his or her staff is trained and equipped with the proper medications to identify and treat them.   Report to the nurse immediately if you experience any of the following symptoms: swelling, itching or redness of the skin, hives, watery eyes/nose, breathing difficulty, excessive sneezing, coughing, stomach pain, diarrhea, or light headedness. These symptoms may occur within 15-20 minutes after injection and may require medication.   Who Should Administer Allergy Shots?  The preferred location for receiving shots is your prescribing allergist's office. Injections can sometimes be given at another facility where the physician and staff are trained to recognize and treat reactions, and have received instructions by your prescribing allergist.  What if I am late for an injection?   Injection dose will be adjusted depending upon how many days or weeks you are late for your injection.   What if I am sick?   Please report any illness to the nurse before receiving injections. She may adjust your dose or postpone injections depending on your symptoms. If you have fever, flu, sinus infection or chest congestion it is best to postpone allergy injections until you are better.  Never get an allergy injection if your asthma is causing you problems. If your symptoms persist, seek out medical care to get your health problem under control.  What If I am or Become Pregnant:  Women that become pregnant should schedule an appointment with [facility] before receiving any further allergy injections.

## 2023-07-23 ENCOUNTER — Encounter (HOSPITAL_BASED_OUTPATIENT_CLINIC_OR_DEPARTMENT_OTHER): Payer: Self-pay

## 2023-07-23 ENCOUNTER — Other Ambulatory Visit: Payer: Self-pay

## 2023-07-23 ENCOUNTER — Emergency Department (HOSPITAL_BASED_OUTPATIENT_CLINIC_OR_DEPARTMENT_OTHER)
Admission: EM | Admit: 2023-07-23 | Discharge: 2023-07-23 | Disposition: A | Discharge status: Home / Self Care | Attending: Emergency Medicine | Admitting: Emergency Medicine

## 2023-07-23 DIAGNOSIS — J189 Pneumonia, unspecified organism: Secondary | ICD-10-CM | POA: Diagnosis not present

## 2023-07-23 DIAGNOSIS — R059 Cough, unspecified: Secondary | ICD-10-CM | POA: Diagnosis present

## 2023-07-23 LAB — RESP PANEL BY RT-PCR (RSV, FLU A&B, COVID)  RVPGX2
Influenza A by PCR: NEGATIVE
Influenza B by PCR: NEGATIVE
Resp Syncytial Virus by PCR: NEGATIVE
SARS Coronavirus 2 by RT PCR: NEGATIVE

## 2023-07-23 LAB — GROUP A STREP BY PCR: Group A Strep by PCR: NOT DETECTED

## 2023-07-23 MED ORDER — GUAIFENESIN-DM 100-10 MG/5ML PO SYRP: 5.0000 mL | ORAL_SOLUTION | Freq: Three times a day (TID) | ORAL | 0 refills | Status: AC | PRN

## 2023-07-23 MED ORDER — DOXYCYCLINE HYCLATE 100 MG PO CAPS: 100.0000 mg | ORAL_CAPSULE | Freq: Two times a day (BID) | ORAL | 0 refills | Status: AC

## 2023-07-23 NOTE — ED Triage Notes (Addendum)
 Patient reports a sore throat since Tuesday. She reports a cough starting Wednesday that has been productive with green/yellow sputum. Last night she had some hemoptysis and became worried and came in for evaluation. Patient reports trying multiple OTC interventions with no help.

## 2023-07-23 NOTE — ED Provider Notes (Signed)
 [facility] AT [facility] Provider Note   CSN: [id number] Arrival date & time: 07/23/23  9375     Patient presents with: Sore Throat and Cough   Laura Rubio is a 38 y.o. female.    Sore Throat  Cough Patient presents with around 5-day history of sore throat and cough.  Has had some mild sputum production.  Greenish sputum.  No fevers.  Started with sore throat and then developed a cough.  States she is also been coughing up a little bit of blood.  No other bleeding.  No fevers.     Prior to Admission medications   Medication Sig Start Date End Date Taking? Authorizing Provider  doxycycline  (VIBRAMYCIN ) 100 MG capsule Take 1 capsule (100 mg total) by mouth 2 (two) times daily. 07/23/23  Yes Patsey Lot, MD  guaiFENesin -dextromethorphan (ROBITUSSIN DM) 100-10 MG/5ML syrup Take 5 mLs by mouth 3 (three) times daily as needed for cough. 07/23/23  Yes Patsey Lot, MD  ipratropium (ATROVENT ) 0.03 % nasal spray Place 2 sprays into both nostrils 3 (three) times daily as needed for rhinitis. 04/25/23   Iva Marty Saltness, MD  levocetirizine (XYZAL ) 5 MG tablet Take 1 tablet (5 mg total) by mouth every evening. 07/04/23   Iva Marty Saltness, MD  montelukast  (SINGULAIR ) 10 MG tablet Take 1 tablet (10 mg total) by mouth at bedtime. 07/04/23   Iva Marty Saltness, MD  sertraline (ZOLOFT) 100 MG tablet Take 150 mg by mouth daily.    [provider]    Allergies: Patient has no known allergies.    Review of Systems  Respiratory:  Positive for cough.     Updated Vital Signs BP 130/87   Pulse 78   Temp 98.1 F (36.7 C) (Oral)   Resp 18   SpO2 100%   Physical Exam Vitals and nursing note reviewed.  Cardiovascular:     Rate and Rhythm: Regular rhythm.  Pulmonary:     Comments: Few scattered wheezes but on left mid lung fields does have harsh breath sounds. Skin:    Coloration: Skin is not pale.     Findings: No erythema or  rash.  Neurological:     Mental Status: She is alert.     (all labs ordered are listed, but only abnormal results are displayed) Labs Reviewed  GROUP A STREP BY PCR  RESP PANEL BY RT-PCR (RSV, FLU A&B, COVID)  [id number]    EKG: None  Radiology: No results found.   Procedures   Medications Ordered in the ED - No data to display                                  Medical Decision Making Risk OTC drugs. Prescription drug management.   Well-appearing with cough and sore throat.  Slight posterior pharyngeal erythema without exudate.  However on the lung findings does have some lateralizing and localizing findings in left midlung field.  Will treat as pneumonia.  Will give cough medicine and antibiotics.  Doubt other causes of bleeding such as pulmonary embolism or thrombocytopenia.  Will discharge home.     Final diagnoses:  Community acquired pneumonia, unspecified laterality    ED Discharge Orders          Ordered    doxycycline  (VIBRAMYCIN ) 100 MG capsule  2 times daily        07/23/23 0709    guaiFENesin -dextromethorphan (ROBITUSSIN DM) 100-10  MG/5ML syrup  3 times daily PRN        07/23/23 0709               Patsey Lot, MD 07/23/23 [phone]
